# Patient Record
Sex: Female | Born: 1965 | Race: White | Hispanic: Yes | Marital: Married | Smoking: Never smoker
Health system: Southern US, Community
[De-identification: ages and names within clinical notes are randomized; demographics above are authoritative.]

## PROBLEM LIST (undated history)

## (undated) DIAGNOSIS — R079 Chest pain, unspecified: Secondary | ICD-10-CM

## (undated) DIAGNOSIS — E785 Hyperlipidemia, unspecified: Secondary | ICD-10-CM

## (undated) DIAGNOSIS — F329 Major depressive disorder, single episode, unspecified: Secondary | ICD-10-CM

## (undated) DIAGNOSIS — Z833 Family history of diabetes mellitus: Secondary | ICD-10-CM

## (undated) DIAGNOSIS — R0609 Other forms of dyspnea: Secondary | ICD-10-CM

## (undated) DIAGNOSIS — R739 Hyperglycemia, unspecified: Secondary | ICD-10-CM

## (undated) DIAGNOSIS — F410 Panic disorder [episodic paroxysmal anxiety] without agoraphobia: Secondary | ICD-10-CM

## (undated) DIAGNOSIS — K5792 Diverticulitis of intestine, part unspecified, without perforation or abscess without bleeding: Secondary | ICD-10-CM

## (undated) DIAGNOSIS — F419 Anxiety disorder, unspecified: Secondary | ICD-10-CM

## (undated) DIAGNOSIS — R0989 Other specified symptoms and signs involving the circulatory and respiratory systems: Secondary | ICD-10-CM

## (undated) HISTORY — DX: Other specified symptoms and signs involving the circulatory and respiratory systems: R09.89

## (undated) HISTORY — DX: Hyperlipidemia, unspecified: E78.5

## (undated) HISTORY — DX: Family history of diabetes mellitus: Z83.3

## (undated) HISTORY — DX: Hyperglycemia, unspecified: R73.9

## (undated) HISTORY — DX: Panic disorder (episodic paroxysmal anxiety): F41.0

## (undated) HISTORY — PX: ABDOMINOPLASTY: SUR9

## (undated) HISTORY — PX: CHOLECYSTECTOMY: SHX55

## (undated) HISTORY — DX: Anxiety disorder, unspecified: F41.9

## (undated) HISTORY — PX: BREAST REDUCTION SURGERY: SHX8

## (undated) HISTORY — DX: Other forms of dyspnea: R06.09

## (undated) HISTORY — DX: Chest pain, unspecified: R07.9

## (undated) HISTORY — DX: Major depressive disorder, single episode, unspecified: F32.9

## (undated) HISTORY — DX: Diverticulitis of intestine, part unspecified, without perforation or abscess without bleeding: K57.92

---

## 1997-11-25 ENCOUNTER — Other Ambulatory Visit: Admission: RE | Admit: 1997-11-25 | Discharge: 1997-11-25 | Payer: Self-pay | Admitting: Gynecology

## 1997-11-26 ENCOUNTER — Inpatient Hospital Stay (HOSPITAL_COMMUNITY): Admission: AD | Admit: 1997-11-26 | Discharge: 1997-11-26 | Payer: Self-pay | Admitting: Obstetrics and Gynecology

## 1997-11-27 ENCOUNTER — Ambulatory Visit (HOSPITAL_COMMUNITY): Admission: RE | Admit: 1997-11-27 | Discharge: 1997-11-27 | Payer: Self-pay | Admitting: Gynecology

## 1997-11-28 ENCOUNTER — Ambulatory Visit (HOSPITAL_COMMUNITY): Admission: RE | Admit: 1997-11-28 | Discharge: 1997-11-28 | Payer: Self-pay | Admitting: Gynecology

## 1999-06-07 ENCOUNTER — Other Ambulatory Visit: Admission: RE | Admit: 1999-06-07 | Discharge: 1999-06-07 | Payer: Self-pay | Admitting: Obstetrics and Gynecology

## 1999-06-09 ENCOUNTER — Encounter: Payer: Self-pay | Admitting: Obstetrics and Gynecology

## 1999-06-09 ENCOUNTER — Ambulatory Visit (HOSPITAL_COMMUNITY): Admission: RE | Admit: 1999-06-09 | Discharge: 1999-06-09 | Payer: Self-pay | Admitting: Obstetrics and Gynecology

## 1999-11-27 ENCOUNTER — Emergency Department (HOSPITAL_COMMUNITY): Admission: EM | Admit: 1999-11-27 | Discharge: 1999-11-27 | Payer: Self-pay | Admitting: Emergency Medicine

## 2000-02-20 ENCOUNTER — Inpatient Hospital Stay (HOSPITAL_COMMUNITY): Admission: AD | Admit: 2000-02-20 | Discharge: 2000-02-20 | Payer: Self-pay | Admitting: Obstetrics and Gynecology

## 2000-03-13 ENCOUNTER — Ambulatory Visit (HOSPITAL_COMMUNITY): Admission: RE | Admit: 2000-03-13 | Discharge: 2000-03-13 | Payer: Self-pay | Admitting: Obstetrics and Gynecology

## 2000-05-18 ENCOUNTER — Ambulatory Visit (HOSPITAL_COMMUNITY): Admission: RE | Admit: 2000-05-18 | Discharge: 2000-05-18 | Payer: Self-pay | Admitting: Obstetrics and Gynecology

## 2000-05-18 ENCOUNTER — Encounter: Payer: Self-pay | Admitting: Obstetrics and Gynecology

## 2000-05-31 ENCOUNTER — Inpatient Hospital Stay (HOSPITAL_COMMUNITY): Admission: AD | Admit: 2000-05-31 | Discharge: 2000-06-04 | Payer: Self-pay | Admitting: Obstetrics and Gynecology

## 2000-05-31 ENCOUNTER — Encounter (INDEPENDENT_AMBULATORY_CARE_PROVIDER_SITE_OTHER): Payer: Self-pay | Admitting: Specialist

## 2000-07-05 ENCOUNTER — Other Ambulatory Visit: Admission: RE | Admit: 2000-07-05 | Discharge: 2000-07-05 | Payer: Self-pay | Admitting: Obstetrics and Gynecology

## 2001-05-04 ENCOUNTER — Encounter: Payer: Self-pay | Admitting: Family Medicine

## 2001-05-04 ENCOUNTER — Encounter: Admission: RE | Admit: 2001-05-04 | Discharge: 2001-05-04 | Payer: Self-pay | Admitting: Family Medicine

## 2001-11-07 ENCOUNTER — Other Ambulatory Visit: Admission: RE | Admit: 2001-11-07 | Discharge: 2001-11-07 | Payer: Self-pay | Admitting: Obstetrics and Gynecology

## 2002-05-23 ENCOUNTER — Encounter: Admission: RE | Admit: 2002-05-23 | Discharge: 2002-05-23 | Payer: Self-pay | Admitting: Family Medicine

## 2002-05-23 ENCOUNTER — Encounter: Payer: Self-pay | Admitting: Family Medicine

## 2003-06-18 ENCOUNTER — Other Ambulatory Visit: Admission: RE | Admit: 2003-06-18 | Discharge: 2003-06-18 | Payer: Self-pay | Admitting: Obstetrics and Gynecology

## 2004-02-04 ENCOUNTER — Encounter: Admission: RE | Admit: 2004-02-04 | Discharge: 2004-02-04 | Payer: Self-pay | Admitting: Family Medicine

## 2004-03-18 ENCOUNTER — Ambulatory Visit (HOSPITAL_COMMUNITY): Admission: RE | Admit: 2004-03-18 | Discharge: 2004-03-19 | Payer: Self-pay | Admitting: General Surgery

## 2004-03-18 ENCOUNTER — Encounter (INDEPENDENT_AMBULATORY_CARE_PROVIDER_SITE_OTHER): Payer: Self-pay | Admitting: Specialist

## 2004-09-07 ENCOUNTER — Encounter: Admission: RE | Admit: 2004-09-07 | Discharge: 2004-09-07 | Payer: Self-pay | Admitting: Family Medicine

## 2005-09-02 ENCOUNTER — Other Ambulatory Visit: Admission: RE | Admit: 2005-09-02 | Discharge: 2005-09-02 | Payer: Self-pay | Admitting: Obstetrics and Gynecology

## 2005-09-08 ENCOUNTER — Encounter: Admission: RE | Admit: 2005-09-08 | Discharge: 2005-09-08 | Payer: Self-pay | Admitting: Family Medicine

## 2006-10-17 ENCOUNTER — Encounter: Admission: RE | Admit: 2006-10-17 | Discharge: 2006-10-17 | Payer: Self-pay | Admitting: Family Medicine

## 2007-09-10 ENCOUNTER — Encounter: Admission: RE | Admit: 2007-09-10 | Discharge: 2007-09-10 | Payer: Self-pay | Admitting: Family Medicine

## 2007-10-19 ENCOUNTER — Encounter: Admission: RE | Admit: 2007-10-19 | Discharge: 2007-10-19 | Payer: Self-pay | Admitting: Family Medicine

## 2008-10-20 ENCOUNTER — Encounter: Admission: RE | Admit: 2008-10-20 | Discharge: 2008-10-20 | Payer: Self-pay | Admitting: Family Medicine

## 2009-04-07 ENCOUNTER — Encounter: Payer: Self-pay | Admitting: Internal Medicine

## 2009-05-11 ENCOUNTER — Ambulatory Visit: Payer: Self-pay | Admitting: Internal Medicine

## 2009-05-11 DIAGNOSIS — R079 Chest pain, unspecified: Secondary | ICD-10-CM

## 2009-05-11 DIAGNOSIS — R0609 Other forms of dyspnea: Secondary | ICD-10-CM

## 2009-05-11 DIAGNOSIS — R0989 Other specified symptoms and signs involving the circulatory and respiratory systems: Secondary | ICD-10-CM | POA: Insufficient documentation

## 2009-05-11 HISTORY — DX: Chest pain, unspecified: R07.9

## 2009-05-15 ENCOUNTER — Encounter: Payer: Self-pay | Admitting: Internal Medicine

## 2009-05-15 ENCOUNTER — Ambulatory Visit: Payer: Self-pay

## 2009-05-15 ENCOUNTER — Ambulatory Visit (HOSPITAL_COMMUNITY): Admission: RE | Admit: 2009-05-15 | Discharge: 2009-05-15 | Payer: Self-pay | Admitting: Internal Medicine

## 2009-05-15 ENCOUNTER — Ambulatory Visit: Payer: Self-pay | Admitting: Cardiovascular Disease

## 2009-10-26 ENCOUNTER — Encounter: Admission: RE | Admit: 2009-10-26 | Discharge: 2009-10-26 | Payer: Self-pay | Admitting: Obstetrics and Gynecology

## 2010-08-15 LAB — CONVERTED CEMR LAB
Cholesterol: 174 mg/dL (ref 0–200)
LDL Cholesterol: 102 mg/dL — ABNORMAL HIGH (ref 0–99)

## 2010-08-18 ENCOUNTER — Encounter: Payer: Self-pay | Admitting: Family Medicine

## 2010-10-30 IMAGING — MG MM DIGITAL SCREENING
4 series · 4 of 4 positions shown · non-contrast
Comparison: none

DG SCREEN MAMMOGRAM BILATERAL
Bilateral CC and MLO view(s) were taken.

DIGITAL SCREENING MAMMOGRAM WITH CAD:
The breast tissue is almost entirely fatty.  No masses or malignant type calcifications are 
identified.  Compared with prior studies.
Images were processed with CAD.

[R CC]
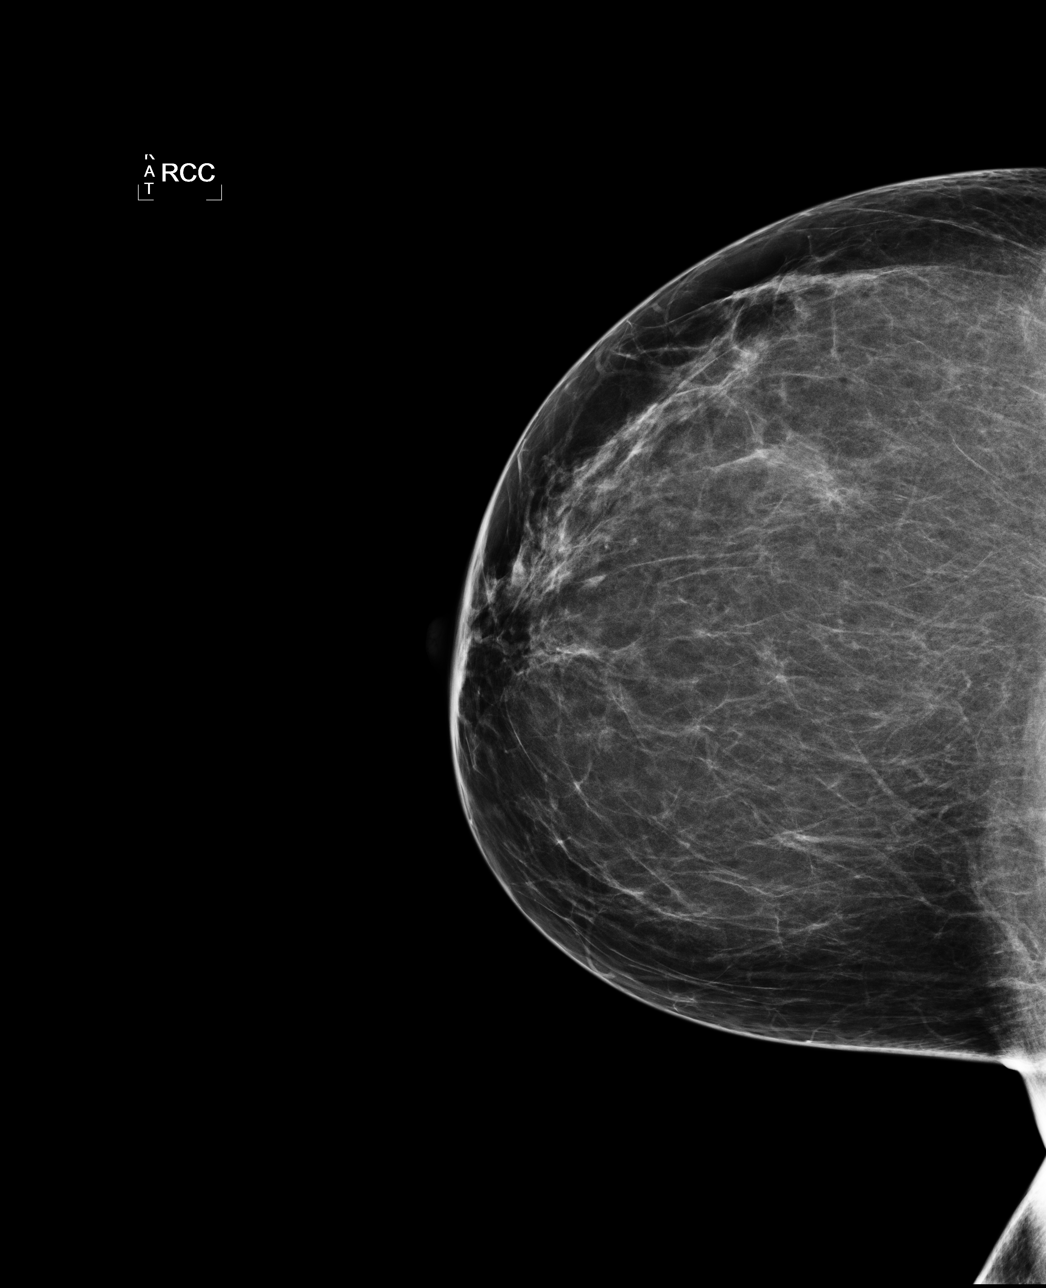

[L CC]
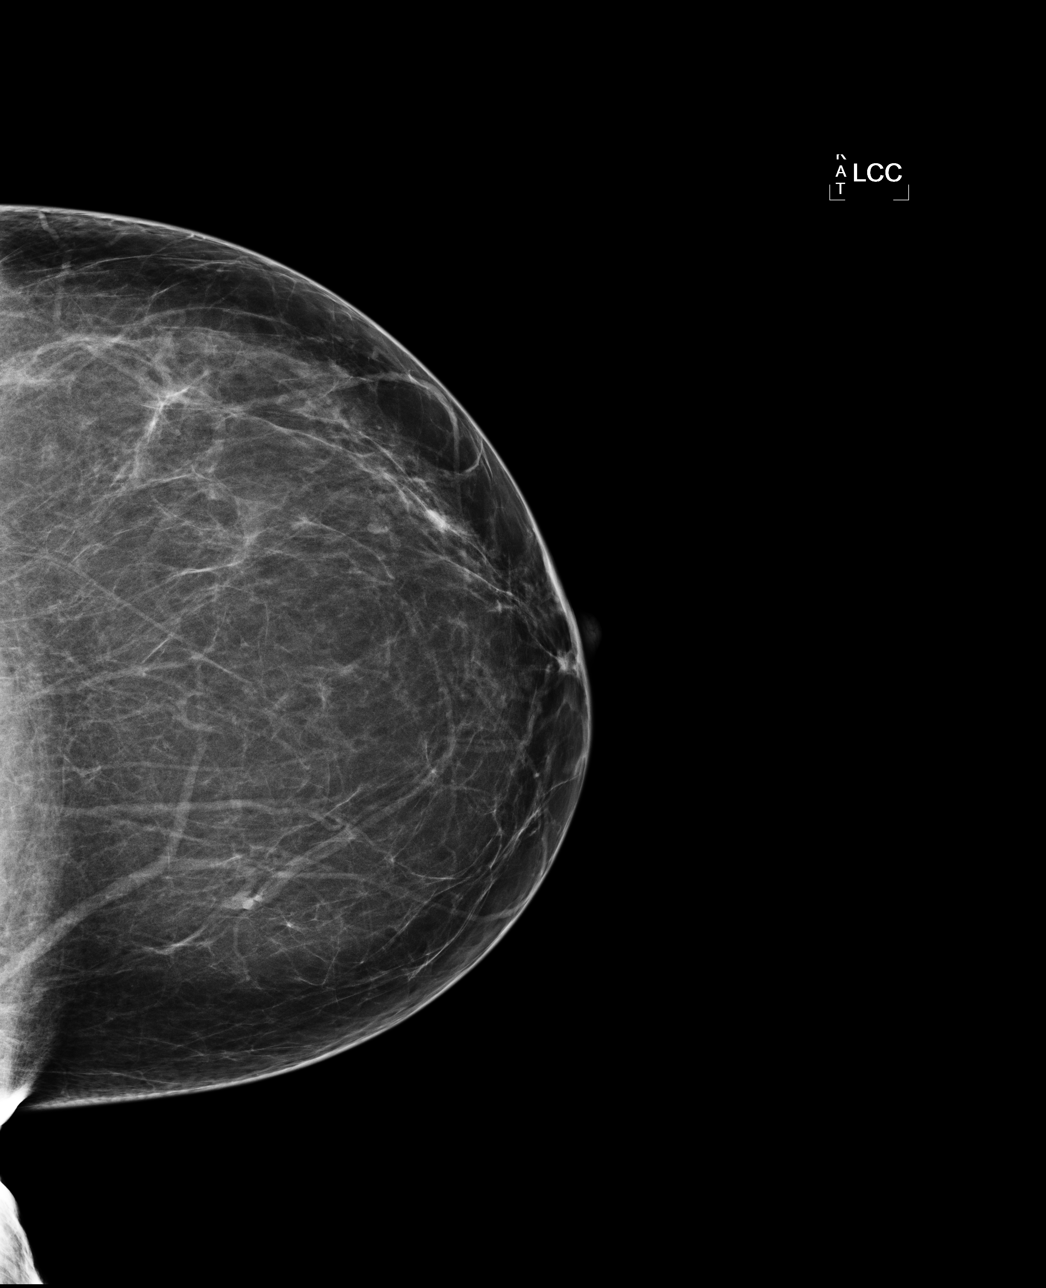

[L MLO]
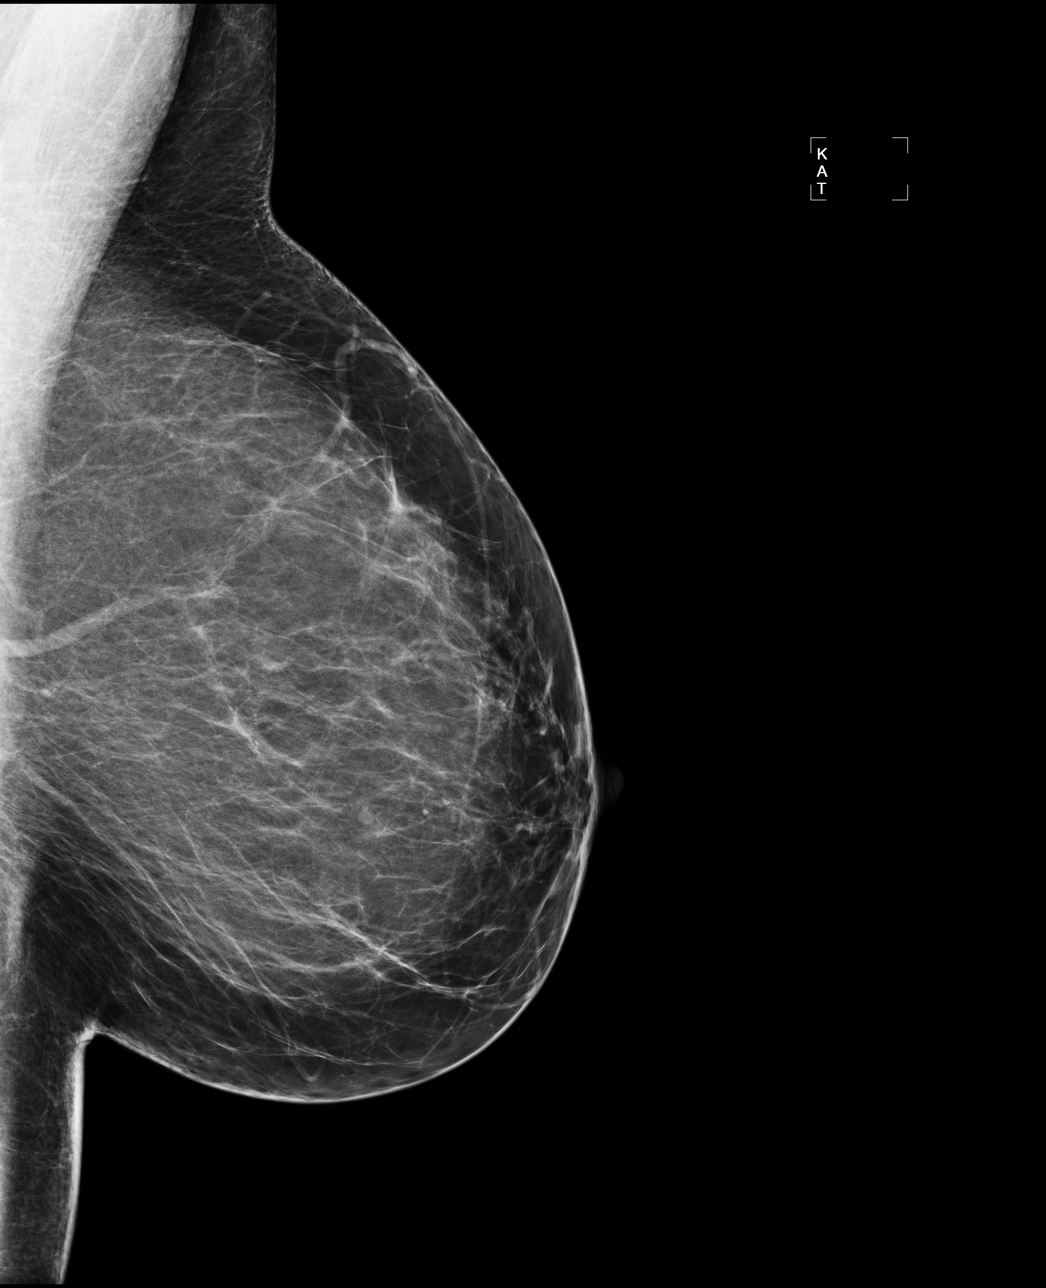

[R MLO]
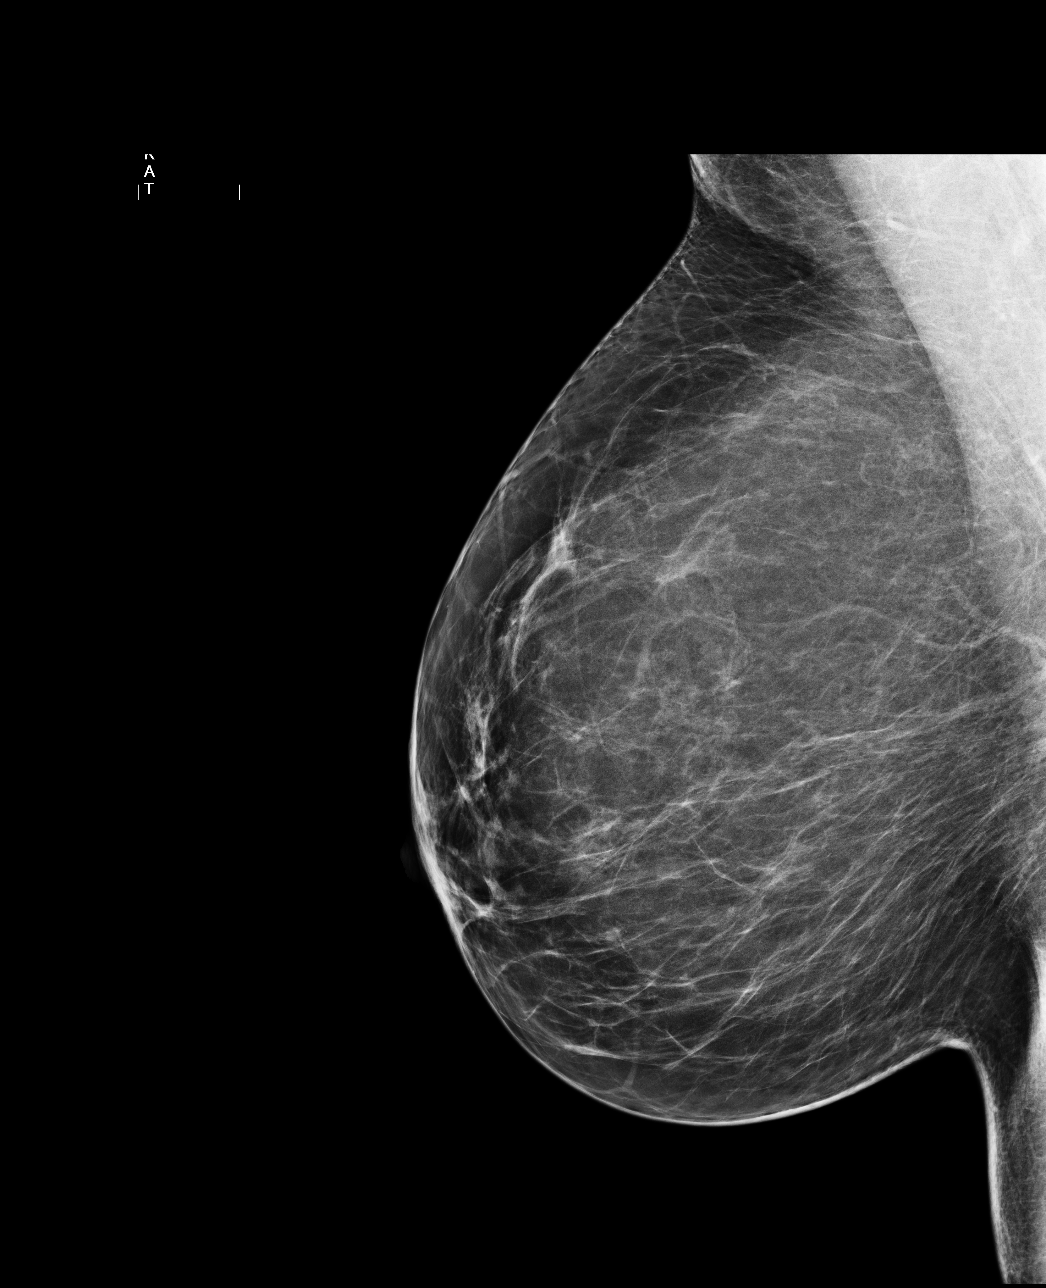

[4 of 4 positions shown; findings below may reference images not displayed]

IMPRESSION: No specific mammographic evidence of malignancy.  Next screening mammogram is recommended in one 
year.

A result letter of this screening mammogram will be mailed directly to the patient.

ASSESSMENT: Negative - BI-RADS 1

Screening mammogram in 1 year.
,

## 2010-12-03 NOTE — Discharge Summary (Signed)
[facility]  Patient:    Megan Rojas, Megan Rojas                      MRN: [id number] Adm. Date:  [id number] Disc. Date: [id number] Attending:  Madelyn Flavors Dictator:   Danie Chandler, R.N.                           Discharge Summary  ADMISSION DIAGNOSIS:          Intrauterine pregnancy at term, for induction of labor due to history of decreased fetal movement.  DISCHARGE DIAGNOSES:          1. Intrauterine pregnancy at term, for induction                                  of labor due to history of decreased fetal                                  movement.                               2. Arrest of dilatation and descent.                               3. 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  PROCEDURES:                   On June 01, 2000, primary low cervical transverse cesarean section and lysis of adhesions.  HISTORY OF PRESENT ILLNESS:   The patient is a 45 year old gravida 27, para 4, Hispanic female at term.  She was admitted for induction due to history of decreased fetal movement.  The patient has a history of HSV, and she has been on Valtrex.  There are no HSV symptoms.  The patient received Pitocin for augmentation of labor, and she progressed throughout the day.  Approximately 1:30 a.m. on June 01, 2000, the patient was still 7-7.5 cm dilated, 100% effaced, vertex at -1 station, with probable OP presentation.  There was no significant change for approximately two hours despite most uterine contractions being adequate.  The decision was made to proceed with a primary cesarean section for arrest of descent and dilation.  HOSPITAL COURSE:              The patient was taken to the operating room and underwent the above-named procedure without complications.  This was productive of a  viable female infant with Apgars of 7 at one minute and 8 at five minutes and an arterial cord pH of 7.32.  Postoperatively on day #1, the patients hemoglobin was 9.6, hematocrit 26.7, and white blood cell count 12.9.  On postoperative day #2  the patient had a good return of bowel function and was tolerating a regular diet.  She was also ambulating well without difficulty and had good pain control.  She was discharged home on postoperative day #3.  CONDITION ON DISCHARGE:       Good.  DIET:                         Regular as tolerated.  ACTIVITY:                     No heavy lifting, no driving, no vaginal entry.  FOLLOW-UP:                    She is to follow up in the office in one to two weeks for an incision check.  DISCHARGE INSTRUCTIONS:       She is to call for temperature greater than 100 degrees, persistent nausea or vomiting, heavy vaginal bleeding, and/or redness or drainage from the incision site.  DISCHARGE MEDICATIONS:        1. Prenatal vitamin 1 p.o. q.d.                               2. Tylox #30 1-2 p.o. q.4h. p.r.n. pain. DD: 06/28/00 TD:  06/28/00 Job: [id number]

## 2010-12-03 NOTE — Op Note (Signed)
[facility]  Patient:    Megan Rojas, Megan Rojas                      MRN: [id number] Adm. Date:  [id number] Attending:  Madelyn Flavors CC:         [facility]   Operative Report  PREOPERATIVE DIAGNOSIS:       Arrest of dilatation and descent.  POSTOPERATIVE DIAGNOSES:      1. Arrest of dilatation and descent.                               2. 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  OPERATION:                    1. Primary cesarean section, low cervical                                  transverse.                               2. Lysis of adhesions.  SURGEON:                      Beather Arbour. Thomasena Edis, M.D.  ANESTHESIA:                   Epidural.  ESTIMATED BLOOD LOSS:         700 cc.  DRAINS:                       Foley.  FLUIDS:                       2000 cc of crystalloid.  The patient received                               ampicillin one hour prior to her section.  COMPLICATIONS:                None.  DESCRIPTION OF PROCEDURE:     The patient was brought to the operating room, identified on the operating table.  After topping off the patients epidural, she was placed in the left uterine displacement position and prepped and draped in a usual sterile fashion.  A Foley catheter had already been placed in the labor room.  Pfannenstiel incision was made and carried down to the fascia.  Fascia was scored in the midline, extended bilaterally using Mayo scissors.  It was then separated free from the underlying muscle.  The muscles were separated in a line down to the symphysis.  The peritoneum was entered bluntly, taking care to avoid bowel or other abdominal contents.  The peritoneal incision was extended superiorly, then  inferiorly, down to the bladder edge.  The bladder blade was placed,  and the bladder flap was developed.  The lower uterine segment was noted to be distally not very well developed at all, such that if the incision had been placed in that portion, it would have extended into the vessels.  Thus, it was placed in a more superior fashion but was still a low cervical transverse incision.  The incision was then extended in a curvilinear fashion using bandage scissors. Upon entry into the uterine cavity, there was noted to be a clot which emanated from the incision.  All instruments were removed from the operative field.  The fetal vertex was delivered without difficulty onto the operative field.  It was noted to be in the ROP position.  There was a nuchal cord x 1, which was easily reduced.  The infant was bulb-suctioned and the remainder of the infant was delivered onto the operative field.  There was noted to be a copious amount of blood in the infants mouth, and as much as this was possible it was bulb suctioned prior to handing the baby to the NICU resuscitation team after doubly clamping and cutting the cord.  Cord blood was collected.  Cord pH was sent and found to be 7.32.  The placenta was manually removed and sent to pathology for examination due to the possible history of abruption, even though this had not been clinically evident.  The uterus was closed in two layers, the first with a running interlocking layer of 0 Monocryl, the second was a running imbricating layer of 0 Monocryl.  This was done after the uterus was exteriorized and wrapped in a wet lap pack, and the bladder blade was replaced.  There were noted to be two bleeding edges with adequate hemostasis achieved using figure-of-eight sutures of 0 Monocryl. There was noted to be adhesion of the omentum to the anterior surface of the uterus, and this was carefully lysed.  Adequate hemostasis was noted.  The right tube was noted to be adhesed closely across the anterior surface of the uterus  such that this would increase the patients risk to develop an ectopic pregnancy.  Normal anatomy was restored with lysis of these adhesions.  After noting the uterus, tubes, and ovaries appeared grossly normal, the uterus was placed back into the abdominal cavity and the uterine incision was again copiously irrigated with warm lactated Ringers.  The gutters were irrigated as well free of clots.  After noting excellent uterine incision hemostasis, the bladder flap was closed using a simple running suture of 2-0 Monocryl in a simple running fashion.  Kelly clamps were placed on the peritoneum, and the peritoneum was closed using a simple running suture of 2-0 Monocryl.  The fascial areas were examined for evidence of hemostasis, and excellent hemostasis was achieved using cautery.  The muscles were tacked together in the midline and using three interrupted sutures of 2-0 Monocryl.  The fascia was closed using a suture of 0 PDS leftmost-angled incision, run to the rightmost-angled incision in a simple running fashion and tied.  The subcutaneous tissue was irrigated copiously with warm lactated Ringers and excellent hemostasis achieved using cautery.  The skin was closed with staples, and Steri-Strips were applied.  The patient tolerated the procedure well without apparent complications, was transferred to the recovery room in stable condition after all instrument, sponge, and needle counts were correct. The baby was found to be a viable female, Apgars 7 and 8, cord  pH 7.32, and went to the normal newborn nursery. DD:  06/01/00 TD:  06/01/00 Job: [id number]

## 2010-12-03 NOTE — Op Note (Signed)
NAME:  Megan Rojas, Megan Rojas                       ACCOUNT NO.:  [id number]   MEDICAL RECORD NO.:  [id number]                   PATIENT TYPE:  OIB   LOCATION:  NA                                   FACILITY:  [facility]   PHYSICIAN:  Jimmye Norman III, M.D.               DATE OF BIRTH:  October 23, 1965   DATE OF PROCEDURE:  03/18/2004  DATE OF DISCHARGE:                                 OPERATIVE REPORT   PREOPERATIVE DIAGNOSES:  Symptomatic cholelithiasis.   POSTOPERATIVE DIAGNOSES:  Symptomatic cholelithiasis.   OPERATION PERFORMED:  Laparoscopic cholecystectomy with cholangiogram.   SURGEON:  Marta Lamas. Lindie Spruce, M.D.   ASSISTANT:  Anselm Pancoast. Zachery Dakins, M.D.   ANESTHESIA:  General endotracheal.   ESTIMATED BLOOD LOSS:  20 to 30 mL.   COMPLICATIONS:  None.   CONDITION:  Stable.   INDICATIONS FOR PROCEDURE:  The patient is a 45 year old female with one  severe attack around 4th of July of abdominal pain in the right upper  quadrant associated with nausea and vomiting.  Comes in now for an elective  laparoscopic cholecystectomy.   OPERATIVE FINDINGS:  The patient had stones in the gallbladder approximately  four 1 to 2 cm stones, no evidence of acute inflammation but there was some  chronic edema in the gallbladder fossa.  Cholangiogram was normal.   DESCRIPTION OF PROCEDURE:  The patient was taken to the operating room and  placed on the table in the supine position.  After an adequate endotracheal  anesthetic was administered, the patient was prepped and draped in the usual  sterile manner exposing the midline and the right upper quadrant.  The patient was placed in reversed Trendelenburg position.  A supraumbilical  incision was made using an 11 blade and a Veress needle was then passed  through the supraumbilical fascia into the peritoneal cavity under.  It was  confirmed to be in position using the saline test and once that was done,  carbon dioxide insufflation was instilled into the  peritoneal up to a  maximal intra-abdominal pressure of 15 mmHg.  We then used an OptiVue  cannula 11-12 mm cannula passed into the peritoneal cavity through the  supraumbilical fascia using the attached camera and light source.  Once the  cannula was in place, two right costal margin 5 mm cannulas and subxiphoid  11-12 mm cannula passed under direct vision into the peritoneal cavity.  The  patient was placed in steeper reversed Trendelenburg.  The left side was  turned down and the dissection begun.   A ratcheted grasper was passed through the lateral most 5 mm cannula and  used to retract the dome of the gallbladder towards the anterior abdominal  wall and right upper quadrant.  A second grasper was passed onto the  infundibulum exposing the peritoneum overlying the hepatoduodenal triangle  and the triangle of Calot.  This peritoneum was dissected out exposing both  the cystic duct  and the cystic artery.  The cystic artery was doubly clipped  proximally and then distally singly clipped and transected.  We then  isolated the cystic duct and passed a proximal clip along the gallbladder  side, made a cholecystodochotomy through which the [facility] catheter was passed  which had been passed through the anterior abdominal wall.  This was for the  patient's cholangiogram.   The cholangiogram showed the findings mentioned previously which was normal  flow into the duodenum. Good proximal filling and no evidence of intraductal  stones.  Once cholangiogram was completed, we removed the cannula from the  cystic duct and subsequently endoclipped the cystic duct distally and  transected it.  We then dissected out the gallbladder from the hepatic fossa  using electrocautery.  Once this was done, there was excellent hemostasis of  the gallbladder bed which was augmented by cauterization after the  gallbladder was completely removed.  Removed the gallbladder from the  supraumbilical site using just  Kelly  clamp and a grasper.  We had to mildly  dilate the site using a Kelly clamp; however, no actual fascial incision was  necessary.  Once this was done, we irrigated the gallbladder fossa using  saline, used about 800 mL of saline in order to irrigate the bed and also  the right upper quadrant.  There was minimal bleeding; however, there was  some bleeding from the site where the [facility] catheter passed through the  anterior abdominal wall.  This was controlled with electrocautery.  The  supraumbilical fascia was reapproximated using a figure-of-eight stitch of 0  Vicryl.  Gas was allowed to escape through all cannulas and once all  cannulas were removed, we injected all skin sites using 0.25% Marcaine with  epinephrine.  We then closed the skin using running subcuticular stitch of 4-  0 Vicryl.  Sterile dressings were applied including Steri-Strips.  All  sponge, needle and instrument counts were correct at the conclusion of the  case.                                               Kathrin Ruddy, M.D.    JW/MEDQ  D:  03/18/2004  T:  03/18/2004  Job:  [id number]

## 2012-08-27 ENCOUNTER — Other Ambulatory Visit: Payer: Self-pay | Admitting: Obstetrics and Gynecology

## 2012-08-27 DIAGNOSIS — R928 Other abnormal and inconclusive findings on diagnostic imaging of breast: Secondary | ICD-10-CM

## 2012-09-06 ENCOUNTER — Ambulatory Visit
Admission: RE | Admit: 2012-09-06 | Discharge: 2012-09-06 | Disposition: A | Discharge status: Home / Self Care | Payer: BC Managed Care – PPO | Source: Ambulatory Visit | Attending: Obstetrics and Gynecology | Admitting: Obstetrics and Gynecology

## 2014-07-18 HISTORY — PX: COLONOSCOPY: SHX174

## 2014-10-24 ENCOUNTER — Other Ambulatory Visit: Payer: Self-pay | Admitting: Obstetrics and Gynecology

## 2014-10-27 LAB — CYTOLOGY - PAP

## 2015-07-19 HISTORY — PX: OTHER SURGICAL HISTORY: SHX169

## 2015-07-19 HISTORY — PX: REDUCTION MAMMAPLASTY: SUR839

## 2015-08-06 NOTE — Progress Notes (Signed)
[place]

## 2015-08-07 ENCOUNTER — Encounter: Payer: Self-pay | Admitting: Cardiovascular Disease

## 2015-08-31 NOTE — Progress Notes (Signed)
[place]on

## 2015-09-07 ENCOUNTER — Encounter: Payer: Self-pay | Admitting: Cardiovascular Disease

## 2015-09-09 ENCOUNTER — Encounter: Payer: Self-pay | Admitting: Cardiovascular Disease

## 2015-10-23 ENCOUNTER — Other Ambulatory Visit: Payer: Self-pay

## 2015-10-23 DIAGNOSIS — Z1231 Encounter for screening mammogram for malignant neoplasm of breast: Secondary | ICD-10-CM

## 2015-11-09 ENCOUNTER — Ambulatory Visit
Admission: RE | Admit: 2015-11-09 | Discharge: 2015-11-09 | Disposition: A | Discharge status: Home / Self Care | Payer: BLUE CROSS/BLUE SHIELD | Source: Ambulatory Visit

## 2015-11-09 DIAGNOSIS — Z1231 Encounter for screening mammogram for malignant neoplasm of breast: Secondary | ICD-10-CM

## 2015-11-10 ENCOUNTER — Ambulatory Visit: Payer: Self-pay

## 2016-05-04 ENCOUNTER — Encounter: Payer: Self-pay | Admitting: Physician Assistant

## 2016-05-04 ENCOUNTER — Ambulatory Visit (INDEPENDENT_AMBULATORY_CARE_PROVIDER_SITE_OTHER): Payer: BLUE CROSS/BLUE SHIELD | Admitting: Physician Assistant

## 2016-05-04 VITALS — BP 144/104 | HR 86 | Ht 64.0 in | Wt 143.4 lb

## 2016-05-04 DIAGNOSIS — F419 Anxiety disorder, unspecified: Secondary | ICD-10-CM | POA: Diagnosis not present

## 2016-05-04 DIAGNOSIS — M79602 Pain in left arm: Secondary | ICD-10-CM | POA: Diagnosis not present

## 2016-05-04 DIAGNOSIS — R002 Palpitations: Secondary | ICD-10-CM | POA: Diagnosis not present

## 2016-05-04 DIAGNOSIS — R0602 Shortness of breath: Secondary | ICD-10-CM | POA: Diagnosis not present

## 2016-05-04 NOTE — Progress Notes (Signed)
Cardiology Office Note    Date:  05/04/2016   ID:  Megan Rojas, DOB 03/30/1966, MRN [id number]  PCP:  Abigail Miyamoto, MD  Cardiologist:  Remotely seen by Dr. Harrington Challenger   Chief Complaint  Patient presents with  . Establish Care    seen with DOD Dr. Ellyn Hack, previously seen by Dr. Harrington Challenger in 2010    History of Present Illness:  Megan Rojas is a 50 y.o. female with past medical history of hyperglycemia, depression and elevated blood pressure but not on any blood pressure medication. She was seen by Dr. Harrington Challenger in 2010 for evaluation of chest pain. She had no known history of coronary artery disease. Her episode of chest discomfort and left arm tingling sometimes can last for days. She eventually underwent a stress echocardiogram which came back negative. Her chest pain was felt to be noncardiac and likely related to musculoskeletal in nature. Patient has not been seen by cardiology since.   She presents to cardiology office today for evaluation of left arm pain and shortness of breath at the recommendation of her PCP. She appears to be agitated. She says she is concerned about her heart as she had multiple sibling with heart issue before age 59. She continue to have this chronic left arm pain radiating from the left upper shoulder all the way down to her left hand. She denies any cervical neck issues or previous shoulder injury. She says her left arm pain sometimes can last days at a time. She also mentions that when she exerted herself, she feels like sometimes she cannot catch a deep breath. Interestingly, the same thing happens when she is agitated and feels like she is about to have a panic attack. Her mother just passed away a month ago therefore she is under a lot of stress. She also says in the past 3 month, she has had 3-4 episodes of palpitation. The longest episode was 4-5 minutes each.    Past Medical History:  Diagnosis Date  . CHEST PAIN UNSPECIFIED 05/11/2009   Qualifier:  Diagnosis of  By: Marilynne Halsted, RN, BSN, Jacquelyn    . Depression   . Elevated blood pressure   . Hyperglycemia   . OTHER DYSPNEA AND RESPIRATORY ABNORMALITIES 05/11/2009   Qualifier: Diagnosis of  By: Marilynne Halsted RN, BSN, Jacquelyn      Past Surgical History:  Procedure Laterality Date  . ABDOMINOPLASTY     "tummy tuck"  . BREAST REDUCTION SURGERY      Current Medications: Outpatient Medications Prior to Visit  Medication Sig Dispense Refill  . buPROPion (WELLBUTRIN XL) 300 MG 24 hr tablet Take 300 mg by mouth daily.    Marland Kitchen zolpidem (AMBIEN) 5 MG tablet Take 5 mg by mouth at bedtime as needed for sleep.    Marland Kitchen estradiol (ESTRACE) 2 MG tablet Take 2 mg by mouth daily.    . MetroNIDAZOLE-Cleanser 0.75 % (Cream) KIT Apply topically. Apply 1 application a thin layer to affected area twice a day as needed     No facility-administered medications prior to visit.      Allergies:   Review of patient's allergies indicates no known allergies.   Social History   Social History  . Marital status: Married    Spouse name: N/A  . Number of children: N/A  . Years of education: N/A   Social History Main Topics  . Smoking status: Never Smoker  . Smokeless tobacco: None  . Alcohol use Yes  Comment: occasionally  . Drug use: No  . Sexual activity: Yes   Other Topics Concern  . None   Social History Narrative  . None     Family History:  The patient's family history includes Diabetes in her mother; Heart attack in her sister; Transient ischemic attack in her brother.   ROS:   Please see the history of present illness.    ROS All other systems reviewed and are negative.   PHYSICAL EXAM:   VS:  BP (!) 144/104   Pulse 86   Ht _0  (1.626 m)   Wt 143 lb 6.4 oz (65 kg)   BMI 24.61 kg/m    GEN: Well nourished, well developed, in no acute distress  HEENT: normal  Neck: no JVD, carotid bruits, or masses Cardiac: RRR; no murmurs, rubs, or gallops,no edema  Respiratory:  clear to  auscultation bilaterally, normal work of breathing GI: soft, nontender, nondistended, + BS MS: no deformity or atrophy  Skin: warm and dry, no rash Neuro:  Alert and Oriented x 3, Strength and sensation are intact Psych: euthymic mood, full affect  Wt Readings from Last 3 Encounters:  05/04/16 143 lb 6.4 oz (65 kg)  05/11/09 128 lb (58.1 kg)      Studies/Labs Reviewed:   EKG:  EKG is ordered today.  The ekg ordered today demonstrates NSR without significant ST-T wave changes  Recent Labs: No results found for requested labs within last 8760 hours.   Lipid Panel    Component Value Date/Time   CHOL 174 05/11/2009 0000   TRIG 91.0 05/11/2009 0000   HDL 53.60 05/11/2009 0000   CHOLHDL 3 05/11/2009 0000   VLDL 18.2 05/11/2009 0000   LDLCALC 102 (H) 05/11/2009 0000    Additional studies/ records that were reviewed today include:    Stress echo 05/15/2009    ASSESSMENT:    1. Left arm pain   2. Palpitation   3. Anxiety   4. SOB (shortness of breath)      PLAN:  In order of problems listed above:  1. Left arm pain: Unfortunately we did not receive record from PCPs office today although no EKG. Differential diagnosis for her left arm pain including angina versus impinged cervical nerve versus rotator cuff problem. I try to reproduce the symptom by making her lift up her arm, it did not cause any discomfort. I think the chance of significant angina is fairly low in this patient given the persistent symptoms for days at a time. Will discuss with M.D., potentially consider echocardiogram, cardiopulmonary stress test and a calcium score as initial evaluation. If later she does develop exertional symptom, a coronary CT would be reasonable. Note despite her history of childhood murmur, I did not appreciate significant heart murmur on physical exam.  2. Palpitation: She only had 3 episode of palpitation in the last 3 months, her symptom is fairly short acting, I think observation  is reasonable in this case. 24 or 48 hour Holter monitor would not be beneficial in this case due to the rarity of the symptom. If it does increase in frequency, we can potentially consider a 30 day event monitor.  3. Anxiety/Depression: Patient appears to be very anxious while singing in the room, talking very fast. We'll obtain a TSH to rule out possible hyperthyroidism. Otherwise, I am hesitant to add a beta blocker for her anxiety given her history of depression. She says years ago, she used to cry uncontrollably, however she is happy  now, she does take antidepressant    Medication Adjustments/Labs and Tests Ordered: Current medicines are reviewed at length with the patient today.  Concerns regarding medicines are outlined above.  Medication changes, Labs and Tests ordered today are listed in the Patient Instructions below. Patient Instructions  Medication Instructions:  Continue current medications  Labwork: TSH  Testing/Procedures: Your physician has requested that you have a Cardiopulmonary stress test. For further information please visit [email or web address]. Please follow instruction sheet, as given.  Your physician has requested that you have an echocardiogram. Echocardiography is a painless test that uses sound waves to create images of your heart. It provides your doctor with information about the size and shape of your heart and how well your heart's chambers and valves are working. This procedure takes approximately one hour. There are no restrictions for this procedure.  Your physician has requested that you have a Coronary Calcium scoring test   Follow-Up: Your physician recommends that you schedule a follow-up appointment in: 3 Months with Dr Harrington Challenger   Any Other Special Instructions Will Be Listed Below (If Applicable).     If you need a refill on your cardiac medications before your next appointment, please call your pharmacy.      Megan Rojas, [place]  05/04/2016  4:32 PM    [facility] [place], [place], [place]  [address] Phone: [phone]; Fax: [phone]    I have seen, examined and evaluated the patient this afternoon along with Megan Rojas .  After reviewing all the available data and chart, we discussed the patients laboratory, study & physical findings as well as symptoms in detail. I agree with his findings, examination as well as impression recommendations as per our discussion.    For patient of Dr. Dorris Carnes who has a significant family history of coronary disease returns for evaluation of left arm pain. This is in the setting of a lot of stress related to the recent death of her mother. She seems very distressed. She notes left-sided arm pain and exertional dyspnea. I do with the plan to evaluate this with a CPET as opposed to just GXT -- can better assess etiology of DOE.  May potentially benefit from Echo pending these results.  Coronary Calcium score is a good choice for risk stratification as well.  Pending these results, she will follow-up annually or more urgently if tests are abnormal.    Megan Rojas, M.D., M.S. Interventional Cardiologist   Pager # [phone] Phone # [phone] [address] [place], [place] [address]

## 2016-05-04 NOTE — Patient Instructions (Addendum)
Medication Instructions:  Continue current medications  Labwork: TSH  Testing/Procedures: Your physician has requested that you have a Cardiopulmonary stress test. For further information please visit [email or web address] Please follow instruction sheet, as given.  Your physician has requested that you have an echocardiogram. Echocardiography is a painless test that uses sound waves to create images of your heart. It provides your doctor with information about the size and shape of your heart and how well your heart's chambers and valves are working. This procedure takes approximately one hour. There are no restrictions for this procedure.  Your physician has requested that you have a Coronary Calcium scoring test   Follow-Up: Your physician recommends that you schedule a follow-up appointment in: 3 Months with Dr Tenny Crawoss   Any Other Special Instructions Will Be Listed Below (If Applicable).     If you need a refill on your cardiac medications before your next appointment, please call your pharmacy.

## 2016-05-25 ENCOUNTER — Ambulatory Visit (HOSPITAL_COMMUNITY): Payer: BLUE CROSS/BLUE SHIELD | Attending: Cardiology

## 2016-05-25 ENCOUNTER — Other Ambulatory Visit: Payer: BLUE CROSS/BLUE SHIELD | Admitting: *Deleted

## 2016-05-25 ENCOUNTER — Other Ambulatory Visit: Payer: Self-pay

## 2016-05-25 ENCOUNTER — Ambulatory Visit (INDEPENDENT_AMBULATORY_CARE_PROVIDER_SITE_OTHER)
Admission: RE | Admit: 2016-05-25 | Discharge: 2016-05-25 | Disposition: A | Discharge status: Home / Self Care | Payer: Self-pay | Source: Ambulatory Visit | Attending: Physician Assistant | Admitting: Physician Assistant

## 2016-05-25 DIAGNOSIS — R0602 Shortness of breath: Secondary | ICD-10-CM | POA: Diagnosis present

## 2016-05-25 DIAGNOSIS — R002 Palpitations: Secondary | ICD-10-CM | POA: Insufficient documentation

## 2016-05-25 DIAGNOSIS — I200 Unstable angina: Secondary | ICD-10-CM

## 2016-05-25 DIAGNOSIS — M79602 Pain in left arm: Secondary | ICD-10-CM | POA: Diagnosis not present

## 2016-05-25 LAB — TSH: TSH: 3.33 m[IU]/L

## 2016-05-25 NOTE — Progress Notes (Signed)
Sh

## 2016-05-25 NOTE — Addendum Note (Signed)
Addended by: Tonita PhoenixBOWDEN, Westin Knotts K on: 05/25/2016 09:10 AM   Modules accepted: Orders

## 2016-05-30 ENCOUNTER — Ambulatory Visit (HOSPITAL_COMMUNITY): Payer: BLUE CROSS/BLUE SHIELD | Attending: Cardiology

## 2016-05-30 DIAGNOSIS — R002 Palpitations: Secondary | ICD-10-CM | POA: Insufficient documentation

## 2016-05-30 DIAGNOSIS — R0602 Shortness of breath: Secondary | ICD-10-CM | POA: Diagnosis not present

## 2016-05-30 DIAGNOSIS — M79602 Pain in left arm: Secondary | ICD-10-CM | POA: Insufficient documentation

## 2016-05-31 ENCOUNTER — Other Ambulatory Visit (HOSPITAL_COMMUNITY): Payer: Self-pay | Admitting: *Deleted

## 2016-05-31 DIAGNOSIS — M79602 Pain in left arm: Secondary | ICD-10-CM

## 2016-05-31 DIAGNOSIS — R002 Palpitations: Secondary | ICD-10-CM

## 2016-05-31 DIAGNOSIS — R0602 Shortness of breath: Secondary | ICD-10-CM | POA: Diagnosis not present

## 2016-06-07 ENCOUNTER — Telehealth: Payer: Self-pay | Admitting: Physician Assistant

## 2016-06-07 NOTE — Telephone Encounter (Signed)
Returning your call. °

## 2016-06-07 NOTE — Telephone Encounter (Signed)
Spoke to patient regarding recent echo stress test results and recommendations for BP monitoring at home. She does note she checks her BP at home, but often in response to situational symptoms. Notes sometimes she gets stressed or face gets flushed and she will check BP and noted it to be high. I advised checking at rest, best thing to do would be to establish a daily time to check reading and establish a trend rather than checking in context of symptoms. She is going to take 1-2 weeks of readings and call us back with her journal entries.

## 2016-09-28 ENCOUNTER — Other Ambulatory Visit: Payer: Self-pay | Admitting: Family Medicine

## 2016-09-28 DIAGNOSIS — Z1231 Encounter for screening mammogram for malignant neoplasm of breast: Secondary | ICD-10-CM

## 2016-11-09 ENCOUNTER — Ambulatory Visit
Admission: RE | Admit: 2016-11-09 | Discharge: 2016-11-09 | Disposition: A | Discharge status: Home / Self Care | Payer: BLUE CROSS/BLUE SHIELD | Source: Ambulatory Visit | Attending: Family Medicine | Admitting: Family Medicine

## 2016-11-09 DIAGNOSIS — Z1231 Encounter for screening mammogram for malignant neoplasm of breast: Secondary | ICD-10-CM

## 2016-11-10 ENCOUNTER — Other Ambulatory Visit: Payer: Self-pay | Admitting: Family Medicine

## 2016-11-10 DIAGNOSIS — R928 Other abnormal and inconclusive findings on diagnostic imaging of breast: Secondary | ICD-10-CM

## 2016-11-15 ENCOUNTER — Ambulatory Visit
Admission: RE | Admit: 2016-11-15 | Discharge: 2016-11-15 | Disposition: A | Discharge status: Home / Self Care | Payer: BLUE CROSS/BLUE SHIELD | Source: Ambulatory Visit | Attending: Family Medicine | Admitting: Family Medicine

## 2016-11-15 DIAGNOSIS — R928 Other abnormal and inconclusive findings on diagnostic imaging of breast: Secondary | ICD-10-CM

## 2016-12-01 ENCOUNTER — Other Ambulatory Visit: Payer: Self-pay | Admitting: Family Medicine

## 2016-12-01 ENCOUNTER — Ambulatory Visit
Admission: RE | Admit: 2016-12-01 | Discharge: 2016-12-01 | Disposition: A | Discharge status: Home / Self Care | Payer: BLUE CROSS/BLUE SHIELD | Source: Ambulatory Visit | Attending: Family Medicine | Admitting: Family Medicine

## 2016-12-01 DIAGNOSIS — M25511 Pain in right shoulder: Secondary | ICD-10-CM

## 2017-05-15 ENCOUNTER — Other Ambulatory Visit: Payer: Self-pay | Admitting: Orthopedic Surgery

## 2017-05-15 DIAGNOSIS — M25511 Pain in right shoulder: Secondary | ICD-10-CM

## 2017-05-22 ENCOUNTER — Ambulatory Visit
Admission: RE | Admit: 2017-05-22 | Discharge: 2017-05-22 | Disposition: A | Discharge status: Home / Self Care | Payer: BLUE CROSS/BLUE SHIELD | Source: Ambulatory Visit | Attending: Orthopedic Surgery | Admitting: Orthopedic Surgery

## 2017-05-22 DIAGNOSIS — M25511 Pain in right shoulder: Secondary | ICD-10-CM

## 2017-10-18 ENCOUNTER — Other Ambulatory Visit: Payer: Self-pay | Admitting: Obstetrics and Gynecology

## 2017-11-13 IMAGING — MG 2D DIGITAL SCREENING BILATERAL MAMMOGRAM WITH CAD AND ADJUNCT TO
8 of 12 series · 8 of 28 positions shown · non-contrast
Comparison: Previous exam(s).

CLINICAL DATA: Screening.

EXAM:
2D DIGITAL SCREENING BILATERAL MAMMOGRAM WITH CAD AND ADJUNCT TOMO

[L MLO]
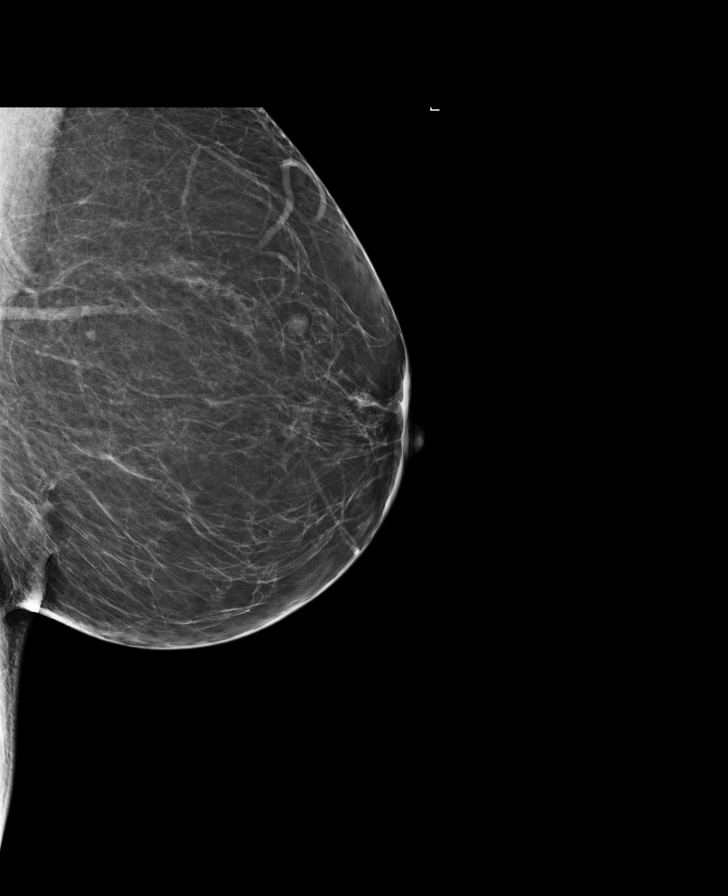

[R MLO synth-2D]
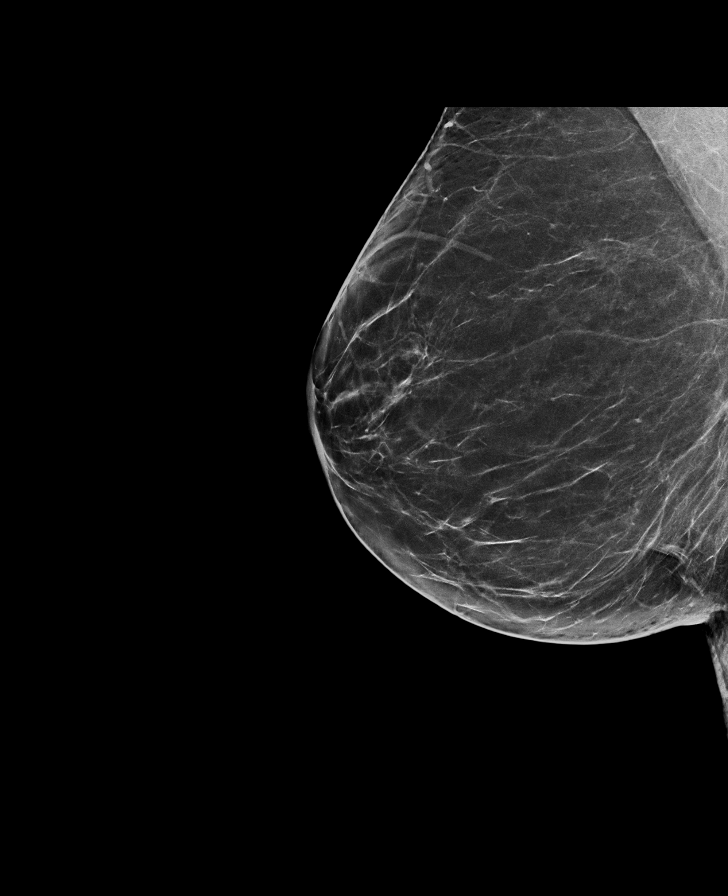

[R MLO]
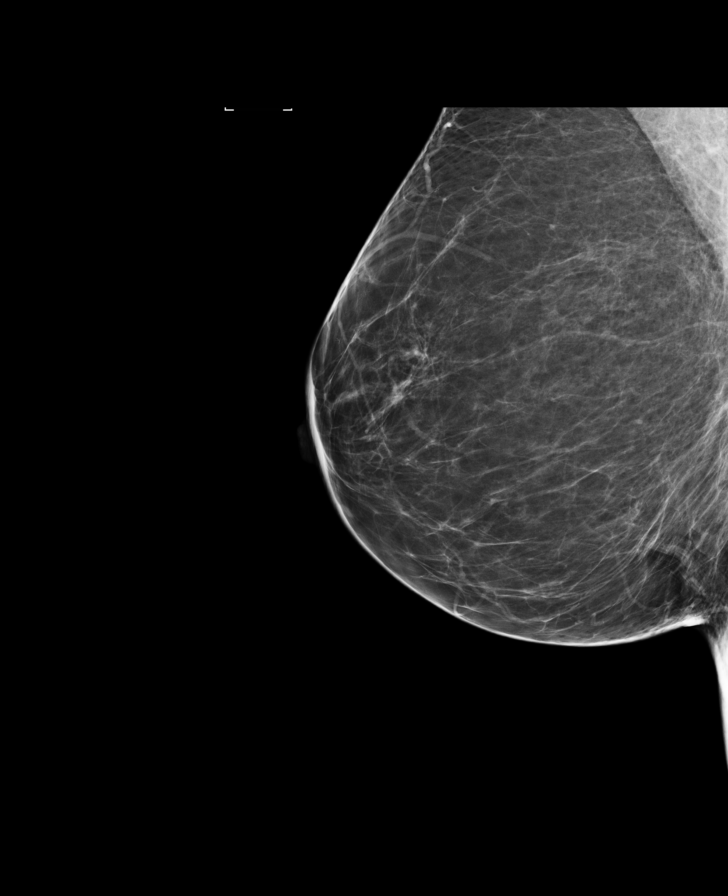

[L MLO synth-2D]
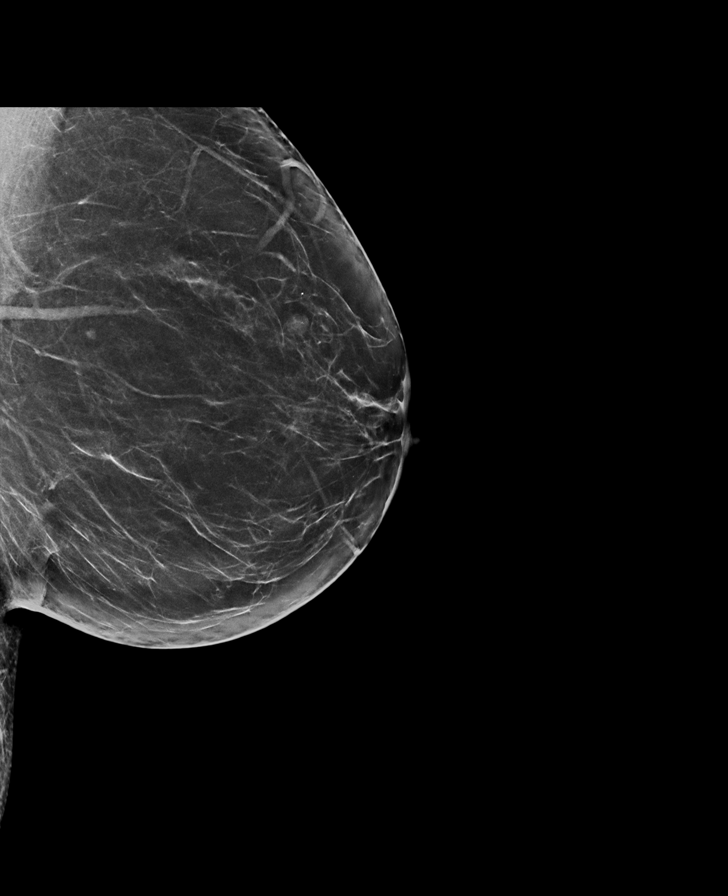

[R CC]
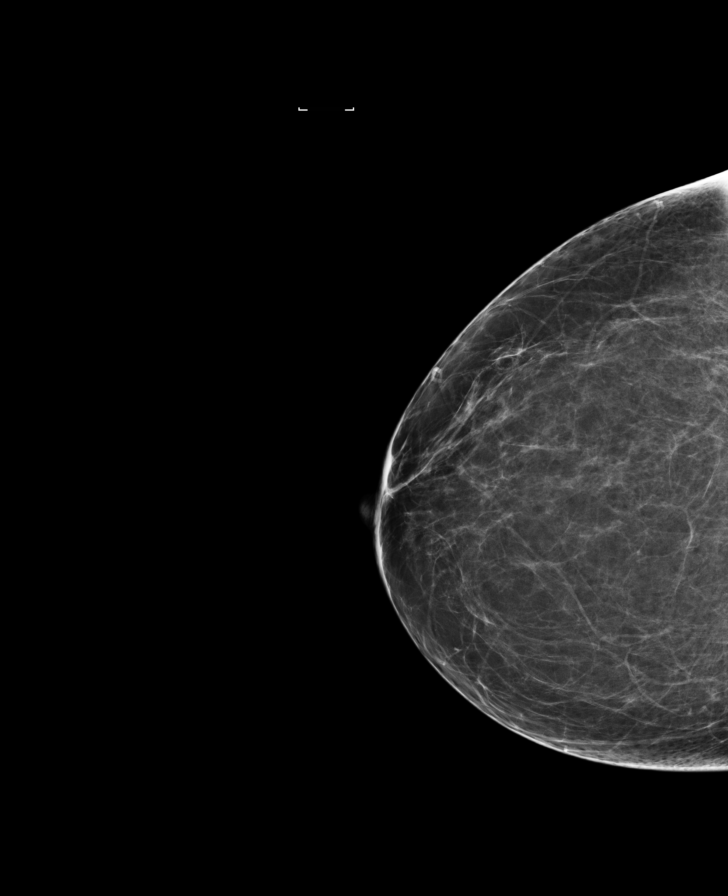

[R CC synth-2D]
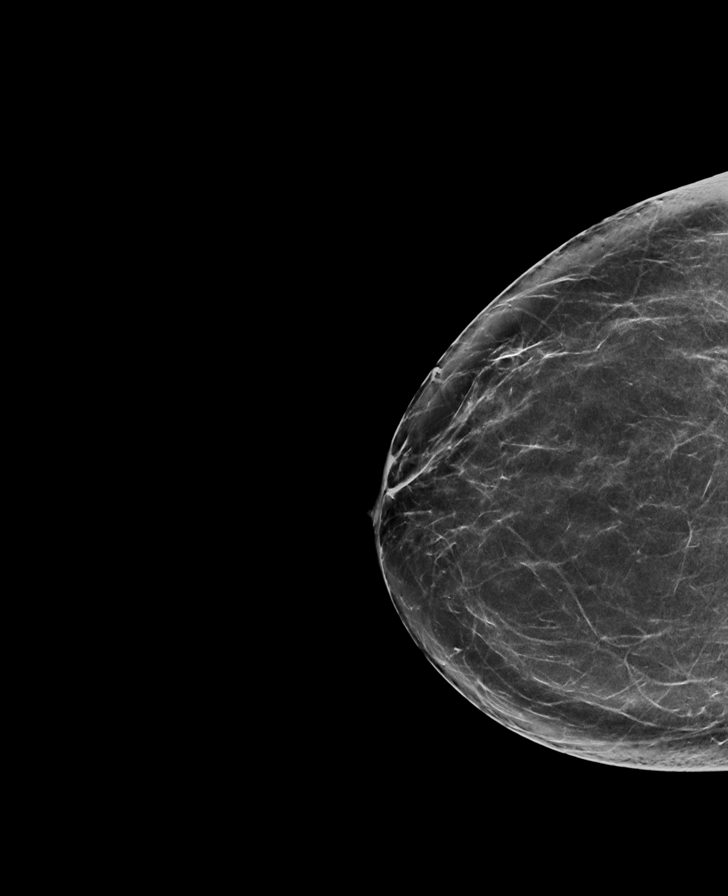

[L CC synth-2D]
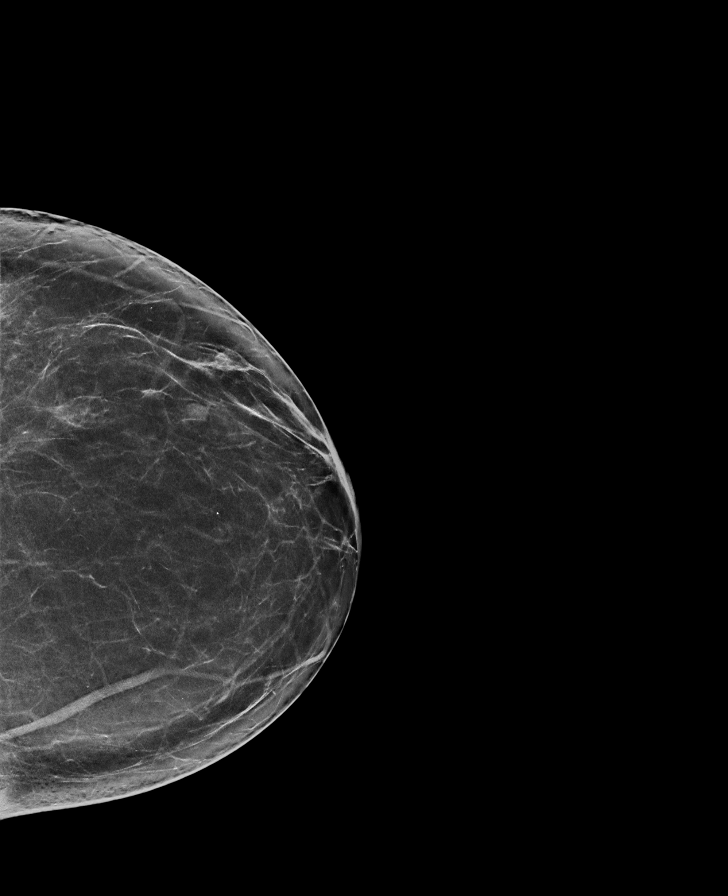

[L CC]
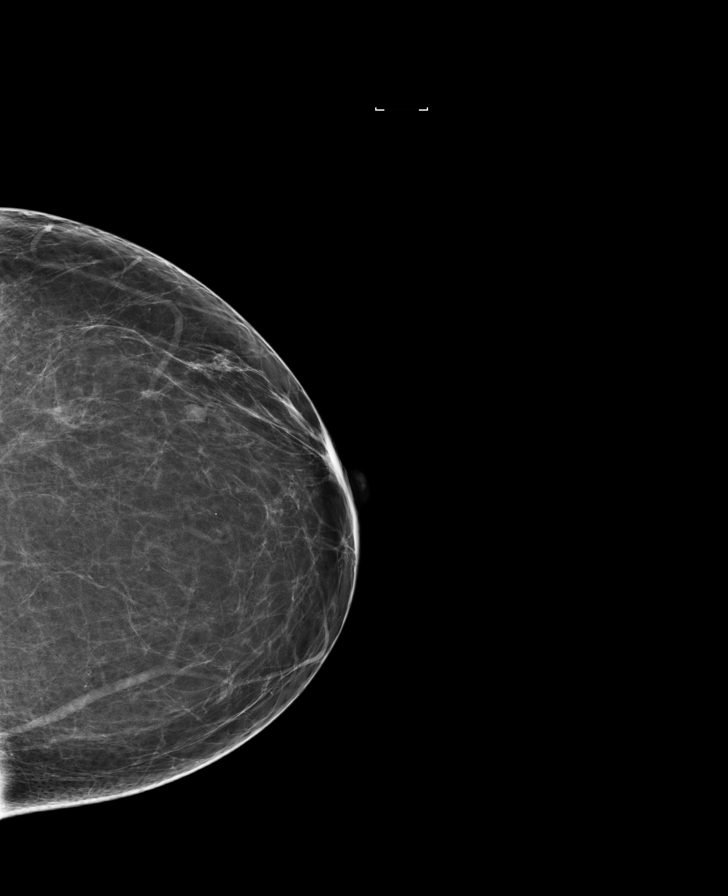

[8 of 28 positions shown; findings below may reference images not displayed]

ACR Breast Density Category b: There are scattered areas of
fibroglandular density.
FINDINGS: In the left breast, a possible mass warrants further evaluation. In
the right breast, no findings suspicious for malignancy.

Images were processed with CAD.
IMPRESSION: Further evaluation is suggested for possible mass in the left
breast.

RECOMMENDATION:
Diagnostic mammogram and possibly ultrasound of the left breast.
(Code:[id number])

The patient will be contacted regarding the findings, and additional
imaging will be scheduled.

BI-RADS CATEGORY  0: Incomplete. Need additional imaging evaluation
and/or prior mammograms for comparison.

## 2018-01-10 ENCOUNTER — Other Ambulatory Visit: Payer: Self-pay | Admitting: Obstetrics and Gynecology

## 2018-01-10 DIAGNOSIS — R928 Other abnormal and inconclusive findings on diagnostic imaging of breast: Secondary | ICD-10-CM

## 2018-01-11 ENCOUNTER — Ambulatory Visit: Payer: BLUE CROSS/BLUE SHIELD

## 2018-01-11 ENCOUNTER — Ambulatory Visit
Admission: RE | Admit: 2018-01-11 | Discharge: 2018-01-11 | Disposition: A | Discharge status: Home / Self Care | Payer: BLUE CROSS/BLUE SHIELD | Source: Ambulatory Visit | Attending: Obstetrics and Gynecology | Admitting: Obstetrics and Gynecology

## 2018-01-11 DIAGNOSIS — R928 Other abnormal and inconclusive findings on diagnostic imaging of breast: Secondary | ICD-10-CM

## 2018-08-18 HISTORY — PX: CARDIOVASCULAR STRESS TEST: SHX262

## 2018-08-27 NOTE — Progress Notes (Signed)
HPI: 53 year old female for evaluation of chest pain.  Stress echocardiogram October 2010 normal.  Echocardiogram November 2017 showed normal LV function and mild diastolic dysfunction.  Cardiopulmonary stress test November 2017 showed normal functional capacity with no ventilatory limitation.  Calcium score November 2017 0.  Patient typically has dyspnea with more vigorous activities but not routine activities.  No orthopnea, PND or pedal edema.  Over the past 1 month she has had pain in her left arm that has been continuous.  She has had difficulties with left arm pain in the past requiring injections.  She also states 1 week ago she had chest pain in the left upper chest.  It would last 1 second and resolve and she had 3 separate episodes of this.  She does not have exertional chest pain.  She is concerned because of her family history.  Cardiology asked to evaluate.  Current Outpatient Medications  Medication Sig Dispense Refill  . aspirin EC 81 MG tablet Take 81 mg by mouth daily.     No current facility-administered medications for this visit.      Past Medical History:  Diagnosis Date  . CHEST PAIN UNSPECIFIED 05/11/2009   Qualifier: Diagnosis of  By: Annamary Rummage, RN, BSN, Jacquelyn    . Depression   . Hyperglycemia   . Hyperlipidemia   . OTHER DYSPNEA AND RESPIRATORY ABNORMALITIES 05/11/2009   Qualifier: Diagnosis of  By: Annamary Rummage RN, BSN, Jacquelyn      Past Surgical History:  Procedure Laterality Date  . ABDOMINOPLASTY     "tummy tuck"  . BREAST REDUCTION SURGERY    . REDUCTION MAMMAPLASTY Bilateral 2017    Social History   Socioeconomic History  . Marital status: Married    Spouse name: Not on file  . Number of children: 5  . Years of education: Not on file  . Highest education level: Not on file  Occupational History  . Not on file  Social Needs  . Financial resource strain: Not on file  . Food insecurity:    Worry: Not on file    Inability: Not on file  .  Transportation needs:    Medical: Not on file    Non-medical: Not on file  Tobacco Use  . Smoking status: Never Smoker  . Smokeless tobacco: Never Used  Substance and Sexual Activity  . Alcohol use: Yes    Comment: occasionally  . Drug use: No  . Sexual activity: Yes  Lifestyle  . Physical activity:    Days per week: Not on file    Minutes per session: Not on file  . Stress: Not on file  Relationships  . Social connections:    Talks on phone: Not on file    Gets together: Not on file    Attends religious service: Not on file    Active member of club or organization: Not on file    Attends meetings of clubs or organizations: Not on file    Relationship status: Not on file  . Intimate partner violence:    Fear of current or ex partner: Not on file    Emotionally abused: Not on file    Physically abused: Not on file    Forced sexual activity: Not on file  Other Topics Concern  . Not on file  Social History Narrative  . Not on file    Family History  Problem Relation Age of Onset  . Diabetes Mother   . Breast cancer Mother   .  Heart attack Sister   . Transient ischemic attack Brother     ROS: no fevers or chills, productive cough, hemoptysis, dysphasia, odynophagia, melena, hematochezia, dysuria, hematuria, rash, seizure activity, orthopnea, PND, pedal edema, claudication. Remaining systems are negative.  Physical Exam: Well-developed well-nourished in no acute distress.  Skin is warm and dry.  HEENT is normal.  Neck is supple.  Chest is clear to auscultation with normal expansion.  Cardiovascular exam is regular rate and rhythm.  Abdominal exam nontender or distended. No masses palpated. Extremities show no edema. neuro grossly intact  ECG-normal sinus rhythm at a rate of 67, nonspecific ST changes.  Personally reviewed  A/P  1 chest pain/left upper extremity pain-symptoms extremely atypical.  She is very concerned given her family history.  I will arrange a  stress echocardiogram for risk stratification.   2 hyperlipidemia-recent LDL elevated.  I have discussed the importance of diet and exercise with her.  May need statin in the future.  Follow-up primary care.  3 anxiety/depression-follow-up primary care.  Olga Millers, MD

## 2018-08-29 ENCOUNTER — Ambulatory Visit (INDEPENDENT_AMBULATORY_CARE_PROVIDER_SITE_OTHER): Payer: No Typology Code available for payment source | Admitting: Cardiology

## 2018-08-29 ENCOUNTER — Encounter: Payer: Self-pay | Admitting: Cardiology

## 2018-08-29 VITALS — BP 110/82 | HR 67 | Ht 64.0 in | Wt 144.0 lb

## 2018-08-29 DIAGNOSIS — R072 Precordial pain: Secondary | ICD-10-CM

## 2018-08-29 DIAGNOSIS — E7800 Pure hypercholesterolemia, unspecified: Secondary | ICD-10-CM

## 2018-08-29 NOTE — Patient Instructions (Signed)
Medication Instructions:   NO CHANGE  Testing/Procedures:  Your physician has requested that you have a stress echocardiogram. For further information please visit [email or web address] Please follow instruction sheet as given.   Follow-Up:  Your physician recommends that you schedule a follow-up appointment in: AS NEEDED PENDING TEST RESULTS    Exercise Stress Echocardiogram  An exercise stress echocardiogram is a test that checks how well your heart is working. For this test, you will walk on a treadmill to make your heart beat faster. This test uses sound waves (ultrasound) and a computer to make pictures (images) of your heart. These pictures will be taken before you exercise and after you exercise. What happens before the procedure?  Follow instructions from your doctor about what you cannot eat or drink before the test.  Do not drink or eat anything that has caffeine in it. Stop having caffeine for 24 hours before the test.  Ask your doctor about changing or stopping your normal medicines. This is important if you take diabetes medicines or blood thinners. Ask your doctor if you should take your medicines with water before the test.  If you use an inhaler, bring it to the test.  Do not use any products that have nicotine or tobacco in them, such as cigarettes and e-cigarettes. Stop using them for 4 hours before the test. If you need help quitting, ask your doctor.  Wear comfortable shoes and clothing. What happens during the procedure?  You will be hooked up to a TV screen. Your doctor will watch the screen to see how fast your heart beats during the test.  Before you exercise, a computer will make a picture of your heart. To do this: ? A gel will be put on your chest. ? A wand will be moved over the gel. ? Sound waves from the wand will go to the computer to make the picture.  Your will start walking on a treadmill. The treadmill will start at a slow speed. It will get  faster a little bit at a time. When you walk faster, your heart will beat faster.  The treadmill will be stopped when your heart is working hard.  You will lie down right away so another picture of your heart can be taken.  The test will take 30-60 minutes. What happens after the procedure?  Your heart rate and blood pressure will be watched after the test.  If your doctor says that you can, you may: ? Eat what you usually eat. ? Do your normal activities. ? Take medicines like normal. Summary  An exercise stress echocardiogram is a test that checks how well your heart is working.  Follow instructions about what you cannot eat or drink before the test. Ask your doctor if you should take your normal medicines before the test.  Stop having caffeine for 24 hours before the test. Do not use anything with nicotine or tobacco in it for 4 hours before the test.  A computer will take a picture of your heart before you walk on a treadmill. It will take another picture when you are done walking.  Your heart rate and blood pressure will be watched after the test. This information is not intended to replace advice given to you by your health care provider. Make sure you discuss any questions you have with your health care provider. Document Released: 05/01/2009 Document Revised: 03/27/2016 Document Reviewed: 03/27/2016 Elsevier Interactive Patient Education  2019 [employer].

## 2018-09-03 ENCOUNTER — Ambulatory Visit (HOSPITAL_BASED_OUTPATIENT_CLINIC_OR_DEPARTMENT_OTHER)
Admission: RE | Admit: 2018-09-03 | Discharge: 2018-09-03 | Disposition: A | Discharge status: Home / Self Care | Payer: No Typology Code available for payment source | Source: Ambulatory Visit | Attending: Cardiology | Admitting: Cardiology

## 2018-09-03 DIAGNOSIS — R072 Precordial pain: Secondary | ICD-10-CM

## 2018-09-03 NOTE — Progress Notes (Signed)
  Echocardiogram Echocardiogram Stress Test has been performed.  Megan Rojas T Rabab Currington 09/03/2018, 4:01 PM

## 2019-05-09 ENCOUNTER — Other Ambulatory Visit: Payer: Self-pay | Admitting: Cardiology

## 2019-05-09 DIAGNOSIS — Z20822 Contact with and (suspected) exposure to covid-19: Secondary | ICD-10-CM

## 2019-05-11 LAB — NOVEL CORONAVIRUS, NAA: SARS-CoV-2, NAA: NOT DETECTED

## 2019-07-19 DIAGNOSIS — R7303 Prediabetes: Secondary | ICD-10-CM

## 2019-07-19 HISTORY — DX: Prediabetes: R73.03

## 2019-12-18 DIAGNOSIS — K5730 Diverticulosis of large intestine without perforation or abscess without bleeding: Secondary | ICD-10-CM | POA: Insufficient documentation

## 2019-12-18 DIAGNOSIS — Z8719 Personal history of other diseases of the digestive system: Secondary | ICD-10-CM | POA: Insufficient documentation

## 2019-12-18 DIAGNOSIS — D649 Anemia, unspecified: Secondary | ICD-10-CM | POA: Insufficient documentation

## 2020-04-17 ENCOUNTER — Other Ambulatory Visit: Payer: Self-pay

## 2020-04-20 ENCOUNTER — Ambulatory Visit (INDEPENDENT_AMBULATORY_CARE_PROVIDER_SITE_OTHER): Payer: No Typology Code available for payment source | Admitting: Family Medicine

## 2020-04-20 ENCOUNTER — Encounter: Payer: Self-pay | Admitting: Family Medicine

## 2020-04-20 ENCOUNTER — Other Ambulatory Visit: Payer: Self-pay

## 2020-04-20 VITALS — BP 133/84 | HR 59 | Temp 98.2°F | Resp 16 | Ht 63.75 in | Wt 148.6 lb

## 2020-04-20 DIAGNOSIS — F410 Panic disorder [episodic paroxysmal anxiety] without agoraphobia: Secondary | ICD-10-CM | POA: Diagnosis not present

## 2020-04-20 DIAGNOSIS — F4010 Social phobia, unspecified: Secondary | ICD-10-CM

## 2020-04-20 DIAGNOSIS — F331 Major depressive disorder, recurrent, moderate: Secondary | ICD-10-CM | POA: Diagnosis not present

## 2020-04-20 DIAGNOSIS — R739 Hyperglycemia, unspecified: Secondary | ICD-10-CM | POA: Diagnosis not present

## 2020-04-20 DIAGNOSIS — F411 Generalized anxiety disorder: Secondary | ICD-10-CM

## 2020-04-20 LAB — COMPREHENSIVE METABOLIC PANEL
ALT: 40 U/L — ABNORMAL HIGH (ref 0–35)
AST: 36 U/L (ref 0–37)
Albumin: 4.4 g/dL (ref 3.5–5.2)
Alkaline Phosphatase: 45 U/L (ref 39–117)
BUN: 11 mg/dL (ref 6–23)
CO2: 26 mEq/L (ref 19–32)
Calcium: 9.4 mg/dL (ref 8.4–10.5)
Chloride: 106 mEq/L (ref 96–112)
Creatinine, Ser: 0.73 mg/dL (ref 0.40–1.20)
GFR: 82.59 mL/min (ref >60.00)
Glucose, Bld: 97 mg/dL (ref 70–99)
Potassium: 4.5 mEq/L (ref 3.5–5.1)
Sodium: 140 mEq/L (ref 135–145)
Total Bilirubin: 0.5 mg/dL (ref 0.2–1.2)
Total Protein: 7.1 g/dL (ref 6.0–8.3)

## 2020-04-20 LAB — CBC WITH DIFFERENTIAL/PLATELET
Basophils Absolute: 0 10*3/uL (ref 0.0–0.1)
Basophils Relative: 0.8 % (ref 0.0–3.0)
Eosinophils Absolute: 0.3 10*3/uL (ref 0.0–0.7)
Eosinophils Relative: 5.1 % — ABNORMAL HIGH (ref 0.0–5.0)
HCT: 40.9 % (ref 36.0–46.0)
Hemoglobin: 14.4 g/dL (ref 12.0–15.0)
Lymphocytes Relative: 32.3 % (ref 12.0–46.0)
Lymphs Abs: 1.7 10*3/uL (ref 0.7–4.0)
MCHC: 35.1 g/dL (ref 30.0–36.0)
MCV: 91.7 fl (ref 78.0–100.0)
Monocytes Absolute: 0.4 10*3/uL (ref 0.1–1.0)
Monocytes Relative: 8.5 % (ref 3.0–12.0)
Neutro Abs: 2.8 10*3/uL (ref 1.4–7.7)
Neutrophils Relative %: 53.3 % (ref 43.0–77.0)
Platelets: 220 10*3/uL (ref 150.0–400.0)
RBC: 4.47 Mil/uL (ref 3.87–5.11)
RDW: 13.2 % (ref 11.5–15.5)
WBC: 5.2 10*3/uL (ref 4.0–10.5)

## 2020-04-20 LAB — TSH: TSH: 1.88 u[IU]/mL (ref 0.35–4.50)

## 2020-04-20 LAB — HEMOGLOBIN A1C: Hgb A1c MFr Bld: 5.9 % (ref 4.6–6.5)

## 2020-04-20 MED ORDER — BUPROPION HCL ER (XL) 150 MG PO TB24: 150.0000 mg | ORAL_TABLET | Freq: Every day | ORAL | 1 refills | Status: DC

## 2020-04-20 NOTE — Progress Notes (Signed)
Office Note 04/20/2020  CC:  Chief Complaint  Patient presents with  . Establish Care    Previous PCP at [facility] 6 years ago    HPI:  Megan Rojas is a 54 y.o. female who is here to establish care Patient's most recent primary MD: see above (Dr. Foy Guadalajara). Old records in EPIC/[facility] EMR were reviewed prior to or during today's visit.  GYN is Dr. Rana Snare.  She worries about diabtes b/c strong fam hx.  Sept 8 lipid panel all normal: Tchol 145, trig 151, HDL 47, LDL 72--reviewed from pt's phone today.  Pap and breast exam 03/2020.  I reviewed Dr. Vance Gather most recent note.  Long hx of depression, was on meds for long term, self-d/c'd about 1 yr ago (wellbutrin xl, occ another antidep added). She is going through depressed mood with high anxiety lately.  At least the last 9 mo sx's worsening. Lots of guilt.  Self isolating, feeling social anxiety.  Starting to feel hopeless.  She is irritable, has dec concentration/focus, "snaps at husband" a lot.  Her youngest child just moved to [place] for college. Eats a lot when she gets anx/dep.  She is VERY fearful of being overweight/obese. Has not had counseling in quite a few years, says she doesn't feel comfortable, feels to inhibited and shy.   Says husband is not sympathetic to her feelings/situation. Worries about her kids.    Past Medical History:  Diagnosis Date  . Anxiety and depression   . CHEST PAIN UNSPECIFIED 05/11/2009   Qualifier: Diagnosis of  By: Annamary Rummage, RN, BSN, Jacquelyn    . Diverticulitis    recurrent  . Hyperglycemia   . Hyperlipidemia    statin since approx 2018  . OTHER DYSPNEA AND RESPIRATORY ABNORMALITIES 05/11/2009   Qualifier: Diagnosis of  By: Annamary Rummage, RN, BSN, Jacquelyn    . Panic attacks     Past Surgical History:  Procedure Laterality Date  . ABDOMINOPLASTY     "tummy tuck"  . cardiopulmonary exercise test  2017   NORMAL  . CARDIOVASCULAR STRESS TEST  08/2018   NORMAL/NEG  . CHOLECYSTECTOMY     . COLONOSCOPY  2016   recall 10 yrs  . REDUCTION MAMMAPLASTY Bilateral 2017    Family History  Problem Relation Age of Onset  . Diabetes Mother   . Breast cancer Mother   . Heart attack Sister   . Diabetes Sister   . Transient ischemic attack Brother   . Diabetes Brother   . Diabetes Maternal Grandmother   . Diabetes Maternal Grandfather     Social History   Socioeconomic History  . Marital status: Married    Spouse name: Not on file  . Number of children: 5  . Years of education: Not on file  . Highest education level: Not on file  Occupational History  . Not on file  Tobacco Use  . Smoking status: Never Smoker  . Smokeless tobacco: Never Used  Vaping Use  . Vaping Use: Never used  Substance and Sexual Activity  . Alcohol use: Yes    Comment: occasionally  . Drug use: No  . Sexual activity: Yes  Other Topics Concern  . Not on file  Social History Narrative   Married, 5 children.   Orig from [place], has lived in [place] all her life, though.   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.   No Tob.   Alc: none   Social Determinants of Corporate investment banker Strain:   .  Difficulty of Paying Living Expenses: Not on file  Food Insecurity:   . Worried About Programme researcher, broadcasting/film/video in the Last Year: Not on file  . Ran Out of Food in the Last Year: Not on file  Transportation Needs:   . Lack of Transportation (Medical): Not on file  . Lack of Transportation (Non-Medical): Not on file  Physical Activity:   . Days of Exercise per Week: Not on file  . Minutes of Exercise per Session: Not on file  Stress:   . Feeling of Stress : Not on file  Social Connections:   . Frequency of Communication with Friends and Family: Not on file  . Frequency of Social Gatherings with Friends and Family: Not on file  . Attends Religious Services: Not on file  . Active Member of Clubs or Organizations: Not on file  . Attends Banker Meetings: Not on file  . Marital  Status: Not on file  Intimate Partner Violence:   . Fear of Current or Ex-Partner: Not on file  . Emotionally Abused: Not on file  . Physically Abused: Not on file  . Sexually Abused: Not on file    Outpatient Encounter Medications as of 04/20/2020  Medication Sig  . aspirin EC 81 MG tablet Take 81 mg by mouth daily.  . Bioflavonoid Products (VITAMIN C-BIOFLAVONOIDS) 1000-100 MG TBCR   . Cholecalciferol (VITAMIN D3) 10 MCG (400 UNIT) CAPS   . Multiple Minerals-Vitamins (CALCIUM CITRATE PLUS/MAGNESIUM PO)   . psyllium (REGULOID) 0.52 g capsule   . rosuvastatin (CRESTOR) 10 MG tablet Take 10 mg by mouth daily.  . Zinc 10 MG LOZG   . zolpidem (AMBIEN) 10 MG tablet   . albuterol (VENTOLIN HFA) 108 (90 Base) MCG/ACT inhaler Inhale 2 puffs into the lungs every 6 (six) hours as needed. (Patient not taking: Reported on 04/20/2020)  . buPROPion (WELLBUTRIN XL) 150 MG 24 hr tablet Take 1 tablet (150 mg total) by mouth daily.   No facility-administered encounter medications on file as of 04/20/2020.    No Known Allergies  ROS Review of Systems  Constitutional: Negative for fatigue and fever.  HENT: Negative for congestion and sore throat.   Eyes: Negative for visual disturbance.  Respiratory: Negative for cough.   Cardiovascular: Negative for chest pain.  Gastrointestinal: Negative for abdominal pain and nausea.  Genitourinary: Negative for dysuria.  Musculoskeletal: Negative for back pain and joint swelling.  Skin: Negative for rash.  Neurological: Negative for weakness and headaches.  Hematological: Negative for adenopathy.  Psychiatric/Behavioral: Positive for decreased concentration, dysphoric mood and sleep disturbance. The patient is nervous/anxious.        See hpi    PE; Blood pressure 133/84, pulse (!) 59, temperature 98.2 F (36.8 C), temperature source Oral, resp. rate 16, height 5' 3.75" (1.619 m), weight 148 lb 9.6 oz (67.4 kg), SpO2 98 %. Body mass index is 25.71  kg/m.  Gen: Alert, well appearing.  Patient is oriented to person, place, time, and situation. AFFECT: pleasant, lucid thought and speech. TMH:DQQI: no injection, icteris, swelling, or exudate.  EOMI, PERRLA. Mouth: lips without lesion/swelling.  Oral mucosa pink and moist. Oropharynx without erythema, exudate, or swelling.  Neck - No masses or thyromegaly or limitation in range of motion CV: RRR, no m/r/g.   LUNGS: CTA bilat, nonlabored resps, good aeration in all lung fields. EXT: no clubbing or cyanosis.  no edema.   Pertinent labs:  None (see hpi for last lipid panel).  ASSESSMENT AND  PLAN:   New pt:  1) Recurrent MDD, GAD, social anxiety d/o, panic attacks. She is hypervigilant and has somewhat of a problem of excessive pressure to look attractive---esp regarding her weight. She had felt better when on wellbutrin xl so I'll restart that today. She declined counseling.   TSH and CMET and CBC today.  2) Hx of hyperglycemia (nonfasting, I believe), strong FH of DM. Check glucose now and Hba1c now.  An After Visit Summary was printed and given to the patient.  Spent 35 min with pt today reviewing HPI, reviewing relevant past history, doing exam, reviewing and discussing lab and imaging data, and formulating plans.  Return in about 4 weeks (around 05/18/2020) for f/u dep/anx.  Signed:  Santiago Bumpers, MD           04/20/2020

## 2020-04-23 ENCOUNTER — Ambulatory Visit: Payer: No Typology Code available for payment source | Admitting: Family Medicine

## 2020-05-15 ENCOUNTER — Other Ambulatory Visit: Payer: Self-pay

## 2020-05-15 ENCOUNTER — Other Ambulatory Visit: Payer: Self-pay | Admitting: Family Medicine

## 2020-05-18 ENCOUNTER — Encounter: Payer: Self-pay | Admitting: Family Medicine

## 2020-05-18 ENCOUNTER — Ambulatory Visit (INDEPENDENT_AMBULATORY_CARE_PROVIDER_SITE_OTHER): Payer: No Typology Code available for payment source | Admitting: Family Medicine

## 2020-05-18 ENCOUNTER — Other Ambulatory Visit: Payer: Self-pay

## 2020-05-18 VITALS — BP 117/77 | HR 58 | Temp 97.4°F | Resp 16 | Ht 63.75 in | Wt 143.4 lb

## 2020-05-18 DIAGNOSIS — Z833 Family history of diabetes mellitus: Secondary | ICD-10-CM | POA: Diagnosis not present

## 2020-05-18 DIAGNOSIS — F411 Generalized anxiety disorder: Secondary | ICD-10-CM

## 2020-05-18 DIAGNOSIS — F5101 Primary insomnia: Secondary | ICD-10-CM

## 2020-05-18 DIAGNOSIS — R7303 Prediabetes: Secondary | ICD-10-CM | POA: Diagnosis not present

## 2020-05-18 MED ORDER — ZOLPIDEM TARTRATE 10 MG PO TABS
ORAL_TABLET | ORAL | 5 refills | Status: DC
Start: 2020-05-18 — End: 2020-12-09

## 2020-05-18 MED ORDER — BUPROPION HCL ER (XL) 300 MG PO TB24: 300.0000 mg | ORAL_TABLET | Freq: Every day | ORAL | 1 refills | Status: DC

## 2020-05-18 NOTE — Progress Notes (Signed)
OFFICE VISIT  05/18/2020  CC:  Chief Complaint  Patient presents with  . Follow-up    depression,anxiety   HPI:    Patient is a 54 y.o. female who presents for 1 mo f/u anx/dep. A/P as of last visit: "1) Recurrent MDD, GAD, social anxiety d/o, panic attacks. She is hypervigilant and has somewhat of a problem of excessive pressure to look attractive---esp regarding her weight. She had felt better when on wellbutrin xl so I'll restart that today. She declined counseling.   TSH and CMET and CBC today.  2) Hx of hyperglycemia (nonfasting, I believe), strong FH of DM. Check glucose now and Hba1c now."  INTERIM HX: Labs normal last visit. Feeling better.  Anxiety level still a little high.  Mentions random periods of facial flushing, feeling warmth in face and neck. Mood better. Helping her daughter paint her home and this helps distract her/helps her cope. Diet pretty good, works out 4 x/week. Several family members with DM.  Insomnia: long hx, mind won't shut down. Has been on ambien long term, 10mg  qhs, followed/rx by Dr. , pt requests that I take over mgmt of this prob/med.  Feels no adverse effects. PMP AWARE reviewed today: most recent rx for ambien 10mg  was filled 04/23/20, # 30, rx by Dr. (GYN). No red flags.    Past Medical History:  Diagnosis Date  . Anxiety and depression   . CHEST PAIN UNSPECIFIED 05/11/2009   w/u has been normal.  . Diverticulitis    recurrent  . Family history of diabetes mellitus   . Hyperlipidemia    statin since approx 2018  . Panic attacks   . Prediabetes 2021   HbA1c 5.9%    Past Surgical History:  Procedure Laterality Date  . ABDOMINOPLASTY     "tummy tuck"  . cardiopulmonary exercise test  2017   NORMAL  . CARDIOVASCULAR STRESS TEST  08/2018   NORMAL/NEG  . CHOLECYSTECTOMY    . COLONOSCOPY  2016   recall 10 yrs  . REDUCTION MAMMAPLASTY Bilateral 2017    Outpatient Medications Prior to Visit  Medication Sig  Dispense Refill  . aspirin EC 81 MG tablet Take 81 mg by mouth daily.    . Bioflavonoid Products (VITAMIN C-BIOFLAVONOIDS) 1000-100 MG TBCR     . Cholecalciferol (VITAMIN D3) 10 MCG (400 UNIT) CAPS     . Multiple Minerals-Vitamins (CALCIUM CITRATE PLUS/MAGNESIUM PO)     . psyllium (REGULOID) 0.52 g capsule     . rosuvastatin (CRESTOR) 10 MG tablet Take 10 mg by mouth daily.    . Zinc 10 MG LOZG     . zolpidem (AMBIEN) 10 MG tablet     . buPROPion (WELLBUTRIN XL) 150 MG 24 hr tablet Take 1 tablet (150 mg total) by mouth daily. 30 tablet 1   No facility-administered medications prior to visit.    No Known Allergies  ROS As per HPI  PE: Vitals with BMI 05/18/2020 04/20/2020 08/29/2018  Height 5' 3.75" 5' 3.75" 5\' 4"   Weight 143 lbs 6 oz 148 lbs 10 oz 144 lbs  BMI 24.82 25.72 24.71  Systolic 117 133 06/20/2020  Diastolic 77 84 82  Pulse 58 59 67     Wt Readings from Last 2 Encounters:  05/18/20 143 lb 6.4 oz (65 kg)  04/20/20 148 lb 9.6 oz (67.4 kg)    Gen: alert, oriented x 4, affect pleasant.  Lucid thinking and conversation noted. HEENT: PERRLA, EOMI.   Neck: no LAD,  mass, or thyromegaly. CV: RRR, no m/r/g LUNGS: CTA bilat, nonlabored. NEURO: no tremor or tics noted on observation.  Coordination intact. CN 2-12 grossly intact bilaterally, strength 5/5 in all extremeties.  No ataxia. Face: no rash or flushing noted.  LABS:  Lab Results  Component Value Date   TSH 1.88 04/20/2020   Lab Results  Component Value Date   WBC 5.2 04/20/2020   HGB 14.4 04/20/2020   HCT 40.9 04/20/2020   MCV 91.7 04/20/2020   PLT 220.0 04/20/2020   Lab Results  Component Value Date   CREATININE 0.73 04/20/2020   BUN 11 04/20/2020   NA 140 04/20/2020   K 4.5 04/20/2020   CL 106 04/20/2020   CO2 26 04/20/2020   Lab Results  Component Value Date   ALT 40 (H) 04/20/2020   AST 36 04/20/2020   ALKPHOS 45 04/20/2020   BILITOT 0.5 04/20/2020   Lab Results  Component Value Date   CHOL  174 05/11/2009   Lab Results  Component Value Date   HDL 53.60 05/11/2009   Lab Results  Component Value Date   LDLCALC 102 (H) 05/11/2009   Lab Results  Component Value Date   TRIG 91.0 05/11/2009   Lab Results  Component Value Date   CHOLHDL 3 05/11/2009   Lab Results  Component Value Date   HGBA1C 5.9 04/20/2020    IMPRESSION AND PLAN:  1) GAD, flushing, social anxiety d/o, hx of panic attacks, hx of recurrent MDD: All improved on wellbutrin xl 150mg  qd x 51mo. Increase to 300mg  qd. Hopefully flushing will gradually dissipate on this treatment.  2) Prediabetes: strong FH DM: many sibs with this. She has stable prediabetes the last few years Hba1c 1 mo ago 5.9%). She is doing well with diet/exercise. Has lost 5 lbs in the last month. Plan repeat A1c q47mo.  3) Insomnia: anxiety-related. Long hx of ambien use--effective. I'll assume responsibility for mgmt of this now, rf'd ambien, #30, 1 qhs, #30, RF x 5. Controlled substance contract reviewed with patient today.  Patient signed this and it will be placed in the chart.    An After Visit Summary was printed and given to the patient.  FOLLOW UP: Return in about 6 weeks (around 06/29/2020) for f/u anxiety.  Signed:  5mo, MD           05/18/2020

## 2020-06-12 ENCOUNTER — Other Ambulatory Visit: Payer: Self-pay | Admitting: Family Medicine

## 2020-06-29 ENCOUNTER — Telehealth (INDEPENDENT_AMBULATORY_CARE_PROVIDER_SITE_OTHER): Payer: No Typology Code available for payment source | Admitting: Family Medicine

## 2020-06-29 ENCOUNTER — Other Ambulatory Visit: Payer: Self-pay

## 2020-06-29 ENCOUNTER — Encounter: Payer: Self-pay | Admitting: Family Medicine

## 2020-06-29 VITALS — BP 127/85 | HR 75 | Temp 98.6°F | Wt 145.0 lb

## 2020-06-29 DIAGNOSIS — F339 Major depressive disorder, recurrent, unspecified: Secondary | ICD-10-CM

## 2020-06-29 DIAGNOSIS — F411 Generalized anxiety disorder: Secondary | ICD-10-CM | POA: Diagnosis not present

## 2020-06-29 MED ORDER — BUPROPION HCL ER (XL) 300 MG PO TB24
300.0000 mg | ORAL_TABLET | Freq: Every day | ORAL | 3 refills | Status: DC
Start: 2020-06-29 — End: 2020-07-20

## 2020-06-29 NOTE — Progress Notes (Signed)
Virtual Visit via Video Note  I connected with pt on 06/29/20 at 10:00 AM EST by a video enabled telemedicine application and verified that I am speaking with the correct person using two identifiers.  Location patient: home, [place] Location provider:work or home office Persons participating in the virtual visit: patient, provider  I discussed the limitations of evaluation and management by telemedicine and the availability of in person appointments. The patient expressed understanding and agreed to proceed.   HPI: 54 y/o WF being seen today for 6 week f/u MDD/anxiety. A/P as of last visit: "1) GAD, flushing, social anxiety d/o, hx of panic attacks, hx of recurrent MDD: All improved on wellbutrin xl 150mg  qd x 56mo. Increase to 300mg  qd. Hopefully flushing will gradually dissipate on this treatment.  2) Prediabetes: strong FH DM: many sibs with this. She has stable prediabetes the last few years Hba1c 1 mo ago 5.9%. She is doing well with diet/exercise. Has lost 5 lbs in the last month. Plan repeat A1c q96mo.  3) Insomnia: anxiety-related. Long hx of ambien use--effective. I'll assume responsibility for mgmt of this now, rf'd ambien, #30, 1 qhs, #30, RF x 5. Controlled substance contract reviewed with patient today.  Patient signed this and it will be placed in the chart.  "  INTERIM HX: Doing fine, continues to feel like the wellbutrin has been helping.  Today's appt changed to virtual due to her having some sniffles last 1-2d, just flew in from working in .  No cough, no SOB, no fevers.  Still struggling some with feeling overwhelmed with so many things to do in life; work, family, holidays, recent job in 5mo, [place].  Worries in general about her health and memory a lot.  Brief periods of facial flushing. Tired at the end of the day but nothing profound. No signif hopelessness, anhedonia, or profound sadness.  No SI or HI.  ROS: See pertinent positives and negatives per  HPI.  Past Medical History:  Diagnosis Date   Anxiety and depression    CHEST PAIN UNSPECIFIED 05/11/2009   w/u has been normal.   Diverticulitis    recurrent   Family history of diabetes mellitus    Hyperlipidemia    statin since approx 2018   Panic attacks    Prediabetes 2021   HbA1c 5.9%    Past Surgical History:  Procedure Laterality Date   ABDOMINOPLASTY     "tummy tuck"   cardiopulmonary exercise test  2017   NORMAL   CARDIOVASCULAR STRESS TEST  08/2018   NORMAL/NEG   CHOLECYSTECTOMY     COLONOSCOPY  2016   recall 10 yrs   REDUCTION MAMMAPLASTY Bilateral 2017     Current Outpatient Medications:    aspirin EC 81 MG tablet, Take 81 mg by mouth daily., Disp: , Rfl:    Bioflavonoid Products (VITAMIN C-BIOFLAVONOIDS) 1000-100 MG TBCR, , Disp: , Rfl:    buPROPion (WELLBUTRIN XL) 300 MG 24 hr tablet, Take 1 tablet (300 mg total) by mouth daily., Disp: 30 tablet, Rfl: 1   Cholecalciferol (VITAMIN D3) 10 MCG (400 UNIT) CAPS, , Disp: , Rfl:    Multiple Minerals-Vitamins (CALCIUM CITRATE PLUS/MAGNESIUM PO), , Disp: , Rfl:    psyllium (REGULOID) 0.52 g capsule, , Disp: , Rfl:    rosuvastatin (CRESTOR) 10 MG tablet, Take 10 mg by mouth daily., Disp: , Rfl:    Zinc 10 MG LOZG, , Disp: , Rfl:    zolpidem (AMBIEN) 10 MG tablet, 1 tab po qhs  for insomnia, Disp: 30 tablet, Rfl: 5  EXAM:  VITALS per patient if applicable:  Vitals with BMI 05/18/2020 04/20/2020 08/29/2018  Height 5' 3.75" 5' 3.75" 5\' 4"   Weight 143 lbs 6 oz 148 lbs 10 oz 144 lbs  BMI 24.82 25.72 24.71  Systolic 117 133  Diastolic 77 84 82  Pulse 58 59 67     GENERAL: alert, oriented, appears well and in no acute distress  HEENT: atraumatic, conjunttiva clear, no obvious abnormalities on inspection of external nose and ears  NECK: normal movements of the head and neck  LUNGS: on inspection no signs of respiratory distress, breathing rate appears normal, no obvious gross SOB,  gasping or wheezing  CV: no obvious cyanosis  MS: moves all visible extremities without noticeable abnormality  PSYCH/NEURO: pleasant and cooperative, no obvious depression or anxiety, speech and thought processing grossly intact  LABS:   Chemistry      Component Value Date/Time   NA 140 04/20/2020 1222   K 4.5 04/20/2020 1222   CL 106 04/20/2020 1222   CO2 26 04/20/2020 1222   BUN 11 04/20/2020 1222   CREATININE 0.73 04/20/2020 1222      Component Value Date/Time   CALCIUM 9.4 04/20/2020 1222   ALKPHOS 45 04/20/2020 1222   AST 36 04/20/2020 1222   ALT 40 (H) 04/20/2020 1222   BILITOT 0.5 04/20/2020 1222     Lab Results  Component Value Date   CHOL 174 05/11/2009   HDL 53.60 05/11/2009   LDLCALC 102 (H) 05/11/2009   TRIG 91.0 05/11/2009   CHOLHDL 3 05/11/2009   Lab Results  Component Value Date   HGBA1C 5.9 04/20/2020   ASSESSMENT AND PLAN:  Discussed the following assessment and plan:  GAD, social anxiety d/o, recurrent MDD: continues to improve on wellbutrin xl 300 mg. No significant adverse effect from med. No changes today.  She asked if I would assume resp for managing her HLD/statin (her GYN had been doing this for years) and I said yes I would.   I discussed the assessment and treatment plan with the patient. The patient was provided an opportunity to ask questions and all were answered. The patient agreed with the plan and demonstrated an understanding of the instructions.    F/u: 2 mo  F/u anx, hld, prediab, + CPE  Signed:  06/20/2020, MD           06/29/2020

## 2020-07-18 ENCOUNTER — Other Ambulatory Visit: Payer: Self-pay | Admitting: Family Medicine

## 2020-12-09 ENCOUNTER — Other Ambulatory Visit: Payer: Self-pay | Admitting: Family Medicine

## 2020-12-09 NOTE — Telephone Encounter (Signed)
1 mo supply zolpidem eRx'd. Pt needs f/u.

## 2020-12-09 NOTE — Telephone Encounter (Signed)
Requesting: zolpidem Contract: 05/18/20 UDS: n/a Last Visit:1213/21 Next Visit: advised to f/u 2 mo Last Refill: 05/18/20(30,5)  Please Advise. Medication pending

## 2020-12-10 NOTE — Telephone Encounter (Signed)
Spoke with pt, voiced understanding regarding scheduling appt. Appt scheduled for 6/9

## 2020-12-24 ENCOUNTER — Encounter: Payer: Self-pay | Admitting: Family Medicine

## 2020-12-24 ENCOUNTER — Other Ambulatory Visit: Payer: Self-pay

## 2020-12-24 ENCOUNTER — Ambulatory Visit (INDEPENDENT_AMBULATORY_CARE_PROVIDER_SITE_OTHER): Payer: PRIVATE HEALTH INSURANCE | Admitting: Family Medicine

## 2020-12-24 VITALS — BP 106/74 | HR 67 | Temp 97.6°F | Resp 16 | Ht 63.75 in | Wt 148.2 lb

## 2020-12-24 DIAGNOSIS — F988 Other specified behavioral and emotional disorders with onset usually occurring in childhood and adolescence: Secondary | ICD-10-CM

## 2020-12-24 DIAGNOSIS — F411 Generalized anxiety disorder: Secondary | ICD-10-CM | POA: Diagnosis not present

## 2020-12-24 DIAGNOSIS — Z79899 Other long term (current) drug therapy: Secondary | ICD-10-CM

## 2020-12-24 DIAGNOSIS — E7800 Pure hypercholesterolemia, unspecified: Secondary | ICD-10-CM

## 2020-12-24 DIAGNOSIS — F5101 Primary insomnia: Secondary | ICD-10-CM

## 2020-12-24 DIAGNOSIS — F330 Major depressive disorder, recurrent, mild: Secondary | ICD-10-CM

## 2020-12-24 LAB — COMPREHENSIVE METABOLIC PANEL
ALT: 24 U/L (ref 0–35)
AST: 22 U/L (ref 0–37)
Albumin: 4.4 g/dL (ref 3.5–5.2)
Alkaline Phosphatase: 50 U/L (ref 39–117)
BUN: 17 mg/dL (ref 6–23)
CO2: 29 mEq/L (ref 19–32)
Calcium: 9.5 mg/dL (ref 8.4–10.5)
Chloride: 106 mEq/L (ref 96–112)
Creatinine, Ser: 0.74 mg/dL (ref 0.40–1.20)
GFR: 90.54 mL/min (ref >60.00)
Glucose, Bld: 106 mg/dL — ABNORMAL HIGH (ref 70–99)
Potassium: 4.4 mEq/L (ref 3.5–5.1)
Sodium: 143 mEq/L (ref 135–145)
Total Bilirubin: 0.4 mg/dL (ref 0.2–1.2)
Total Protein: 7.1 g/dL (ref 6.0–8.3)

## 2020-12-24 LAB — LIPID PANEL
Cholesterol: 162 mg/dL (ref 0–200)
HDL: 48.7 mg/dL (ref >39.00)
LDL Cholesterol: 85 mg/dL (ref 0–99)
NonHDL: 113.23
Total CHOL/HDL Ratio: 3
Triglycerides: 143 mg/dL (ref 0.0–149.0)
VLDL: 28.6 mg/dL (ref 0.0–40.0)

## 2020-12-24 MED ORDER — ZOLPIDEM TARTRATE 10 MG PO TABS
ORAL_TABLET | ORAL | 0 refills | Status: DC
Start: 2020-12-24 — End: 2021-03-04

## 2020-12-24 MED ORDER — METHYLPHENIDATE HCL ER (OSM) 27 MG PO TBCR: 27.0000 mg | EXTENDED_RELEASE_TABLET | ORAL | 0 refills | Status: DC

## 2020-12-24 MED ORDER — ROSUVASTATIN CALCIUM 10 MG PO TABS: 10.0000 mg | ORAL_TABLET | Freq: Every day | ORAL | 3 refills | Status: AC

## 2020-12-24 NOTE — Progress Notes (Signed)
OFFICE VISIT  12/24/2020  CC:  Chief Complaint  Patient presents with   Follow-up    RCI; pt is fasting   HPI:    Patient is a 55 y.o. Caucasian female who presents for 6 mo f/u GAD with social anxiety, recurrent MDD, insomnia, and HLD. A/P as of last visit: "GAD, social anxiety d/o, recurrent MDD: continues to improve on wellbutrin xl 300 mg. No significant adverse effect from med. No changes today.   She asked if I would assume resp for managing her HLD/statin (her GYN had been doing this for years) and I said yes I would."  INTERIM HX:  She stopped her wellbutrin 300 mg about 2 wks ago, weaned herself off b/c was not feeling any better regarding her anxiety.  Has a lot of "issues" going on.  Stressed about everything, poor motivation.  She's not happy.  Poor focus and concentration.  Easily sad, poor energy, anhedonia.  Appetite is good. Says she feels like she has had signif tendency to have poor focus and concentration, has to push herself extremely hard to do complex tasks, procrastinates, etc. Has never been treated for ADD.  Sleep is "awful", takes only 1/2 of ambien qhs, still often takes 3 hrs to fall asleep. Says her brain doesn't shut down.  Sleep hygiene is better.  She is not interested in talking to a counselor. Has signif fear of gaining wt on any other antidepressant and says this would make her feel more depressed.  Takes crestor 10mg  qd w/out side effect.  PMP AWARE reviewed today: most recent rx for ambien 10mg  was filled 12/09/20, # 30, rx by me. No red flags.    Past Medical History:  Diagnosis Date   Anxiety and depression    CHEST PAIN UNSPECIFIED 05/11/2009   w/u has been normal.   Diverticulitis    recurrent   Family history of diabetes mellitus    Hyperlipidemia    statin since approx 2018   Panic attacks    Prediabetes 2021   HbA1c 5.9%    Past Surgical History:  Procedure Laterality Date   ABDOMINOPLASTY     "tummy tuck"    cardiopulmonary exercise test  2017   NORMAL   CARDIOVASCULAR STRESS TEST  08/2018   NORMAL/NEG   CHOLECYSTECTOMY     COLONOSCOPY  2016   recall 10 yrs   REDUCTION MAMMAPLASTY Bilateral 2017    Outpatient Medications Prior to Visit  Medication Sig Dispense Refill   Bioflavonoid Products (VITAMIN C-BIOFLAVONOIDS) 1000-100 MG TBCR      Cholecalciferol (VITAMIN D3) 10 MCG (400 UNIT) CAPS      Multiple Minerals-Vitamins (CALCIUM CITRATE PLUS/MAGNESIUM PO)      Zinc 10 MG LOZG      rosuvastatin (CRESTOR) 10 MG tablet Take 10 mg by mouth daily.     zolpidem (AMBIEN) 10 MG tablet TAKE 1 TABLET BY MOUTH AT BEDTIME FOR INSOMNIA 30 tablet 0   buPROPion (WELLBUTRIN XL) 300 MG 24 hr tablet TAKE 1 TABLET BY MOUTH EVERY DAY (Patient not taking: Reported on 12/24/2020) 30 tablet 0   aspirin EC 81 MG tablet Take 81 mg by mouth daily. (Patient not taking: Reported on 12/24/2020)     psyllium (REGULOID) 0.52 g capsule  (Patient not taking: Reported on 12/24/2020)     No facility-administered medications prior to visit.    No Known Allergies  ROS As per HPI  PE: Vitals with BMI 12/24/2020 06/29/2020 05/18/2020  Height 5' 3.75" - 5'  3.75"  Weight 148 lbs 3 oz 145 lbs 143 lbs 6 oz  BMI 25.65 - 24.82  Systolic 106 127 809  Diastolic 74 85 77  Pulse 67 75 58     Gen: Alert, well appearing.  Patient is oriented to person, place, time, and situation. AFFECT: pleasant, lucid thought and speech. No further exam today.  LABS:  Lab Results  Component Value Date   TSH 1.88 04/20/2020   Lab Results  Component Value Date   WBC 5.2 04/20/2020   HGB 14.4 04/20/2020   HCT 40.9 04/20/2020   MCV 91.7 04/20/2020   PLT 220.0 04/20/2020   Lab Results  Component Value Date   CREATININE 0.73 04/20/2020   BUN 11 04/20/2020   NA 140 04/20/2020   K 4.5 04/20/2020   CL 106 04/20/2020   CO2 26 04/20/2020   Lab Results  Component Value Date   ALT 40 (H) 04/20/2020   AST 36 04/20/2020   ALKPHOS 45  04/20/2020   BILITOT 0.5 04/20/2020   Lab Results  Component Value Date   CHOL 174 05/11/2009   Lab Results  Component Value Date   HDL 53.60 05/11/2009   Lab Results  Component Value Date   LDLCALC 102 (H) 05/11/2009   Lab Results  Component Value Date   TRIG 91.0 05/11/2009   Lab Results  Component Value Date   CHOLHDL 3 05/11/2009   Lab Results  Component Value Date   HGBA1C 5.9 04/20/2020    IMPRESSION AND PLAN:  1) MDD, recurrent.  GAD.   Not doing well.  No SI or HI. Suspect that her sx's may be a result of ADD and treating this may then help her mood and anxiety levels improve. We'll hold off from trial of new antidepressant. Start trial of methylphenidate CR 27 mg qd.  Therapeutic expectations and side effect profile of medication discussed today.  Patient's questions answered.  2) Insomnia: not doing well. Hopefully working on #1 above will result in eventually getting better sleep. She'll continue ambien 10mg  qhs prn.  New rx for #30, RF x5 sent in today.  3) HLD: tolerating crestor 10mg  qd. FLP and hepatic panel today.  An After Visit Summary was printed and given to the patient.  FOLLOW UP: Return in about 4 weeks (around 01/21/2021) for f/u ADD/mood. Cpe 6 mo  Signed:  , MD           12/24/2020

## 2021-01-11 ENCOUNTER — Telehealth: Payer: Self-pay | Admitting: Family Medicine

## 2021-01-11 NOTE — Telephone Encounter (Signed)
Pt declined first available appt for 01/13/21. Advised pt to go to urgent care or ED

## 2021-01-11 NOTE — Telephone Encounter (Signed)
Pt called and said that she has poison oak or poison ivy and that she has always been allergic to it and that she was given a shot for it last time and wanted to know if she could come in for it today. She said it is running all over her body

## 2021-01-25 LAB — HM PAP SMEAR: HM Pap smear: NORMAL

## 2021-01-25 LAB — HM MAMMOGRAPHY

## 2021-01-25 LAB — RESULTS CONSOLE HPV: CHL HPV: NEGATIVE

## 2021-03-03 ENCOUNTER — Other Ambulatory Visit: Payer: Self-pay | Admitting: Family Medicine

## 2021-03-03 NOTE — Telephone Encounter (Signed)
Requesting: zolpidem Contract: 05/18/20 UDS: N/A Last Visit: 12/24/20 Next Visit:Return in about 4 weeks (around  01/21/2021) for f/u ADD/mood Last Refill: 12/24/20(30,0)  Please review and advise. Med pending

## 2021-03-04 ENCOUNTER — Other Ambulatory Visit: Payer: Self-pay | Admitting: Family Medicine

## 2021-03-04 MED ORDER — ZOLPIDEM TARTRATE 10 MG PO TABS: ORAL_TABLET | ORAL | 2 refills | Status: DC

## 2021-03-04 NOTE — Telephone Encounter (Signed)
Medication printed, please redo rx. Med pending

## 2021-03-04 NOTE — Addendum Note (Signed)
Addended by: Emi Holes D on: 03/04/2021 09:25 AM   Modules accepted: Orders

## 2021-08-24 ENCOUNTER — Other Ambulatory Visit: Payer: Self-pay | Admitting: Family Medicine

## 2021-08-25 NOTE — Telephone Encounter (Signed)
#  10 tabs eRx'd. Pt overdue for follow up.

## 2021-08-25 NOTE — Telephone Encounter (Signed)
Requesting: Zolpidem Contract: 05/18/20 UDS: N/A Last Visit: 12/24/20 Next Visit: advised to f/u 4 weeks Last Refill: 03/04/21(30,2)  Please Advise. Med pending

## 2021-08-26 NOTE — Telephone Encounter (Signed)
LM for pt regarding medication ?

## 2021-09-01 NOTE — Telephone Encounter (Signed)
Pt returned call and advised appt needed. Offered to schedule appt. Pt agreed and scheduled/

## 2021-09-01 NOTE — Telephone Encounter (Signed)
LM for pt regarding medication and needs to schedule f/u appt

## 2021-09-07 ENCOUNTER — Other Ambulatory Visit: Payer: Self-pay

## 2021-09-07 ENCOUNTER — Ambulatory Visit: Payer: PRIVATE HEALTH INSURANCE | Admitting: Family Medicine

## 2021-09-07 ENCOUNTER — Encounter: Payer: Self-pay | Admitting: Family Medicine

## 2021-09-07 VITALS — BP 106/72 | HR 79 | Temp 97.4°F | Ht 63.75 in | Wt 138.8 lb

## 2021-09-07 DIAGNOSIS — M542 Cervicalgia: Secondary | ICD-10-CM

## 2021-09-07 DIAGNOSIS — F5104 Psychophysiologic insomnia: Secondary | ICD-10-CM | POA: Diagnosis not present

## 2021-09-07 DIAGNOSIS — F411 Generalized anxiety disorder: Secondary | ICD-10-CM | POA: Diagnosis not present

## 2021-09-07 DIAGNOSIS — E785 Hyperlipidemia, unspecified: Secondary | ICD-10-CM | POA: Diagnosis not present

## 2021-09-07 MED ORDER — ZOLPIDEM TARTRATE 10 MG PO TABS: ORAL_TABLET | ORAL | 3 refills | Status: AC

## 2021-09-07 NOTE — Progress Notes (Addendum)
OFFICE VISIT  09/07/2021  CC:  Chief Complaint  Patient presents with   Follow-up   Patient is a 56 y.o. female who presents for f/u GAD, MDD, HLD. I last saw her 8 months ago. A/P as of that visit: "1) MDD, recurrent.  GAD.  Social anxiety disorder.  Not doing well.  No SI or HI. Suspect that her sx's may be a result of ADD and treating this may then help her mood and anxiety levels improve. Start trial of methylphenidate CR 27 mg qd.   We'll hold off from trial of new antidepressant.  She has fears of medications mainly based on their potential to cause her weight gain.   2) Insomnia: not doing well. Hopefully working on #1 above will result in eventually getting better sleep. She'll continue ambien 44m qhs prn.  New rx for #30, RF x5 sent in today.   3) HLD: tolerating crestor 128mqd. FLP and hepatic panel today."  INTERIM HX: C-Met and lipids excellent last visit. MaStephanees happy reports she is doing well.  She did not take the methylphenidate.  She got off the bupropion because she did not want to take medication anymore.  She finds that she is adjusting well to the things in her life and does not need any medication for anxiety at this time.  Denies depressed mood.   She still sleeps pretty poorly, says her mind will not shut down.  Typically gets 3 or 4 hours of sleep per night, often takes only a half of her Ambien tab. PMP AWARE reviewed today: most recent rx for amLorrin Maisas filled 08/25/21, # 10, rx by me.  Rx's in prior months had been for #30 tabs, rx's by me. No red flags.  She is taking her Crestor daily. Diet and exercise habits are great.  ROS as above, plus--> no fevers, no CP, no SOB, no wheezing, no cough, no dizziness, no HAs, no rashes, no melena/hematochezia.  No polyuria or polydipsia.  No myalgias or arthralgias other than her neck pain mentioned above..  No focal weakness, paresthesias, or tremors.  No acute vision or hearing abnormalities.  No dysuria or  unusual/new urinary urgency or frequency.  No recent changes in lower legs. No n/v/d or abd pain.  No palpitations.    Past Medical History:  Diagnosis Date   Anxiety and depression    CHEST PAIN UNSPECIFIED 05/11/2009   w/u has been normal.   Diverticulitis    recurrent   Family history of diabetes mellitus    Hyperlipidemia    statin since approx 2018   Panic attacks    Prediabetes 2021   HbA1c 5.9%    Past Surgical History:  Procedure Laterality Date   ABDOMINOPLASTY     "tummy tuck"   cardiopulmonary exercise test  2017   NORMAL   CARDIOVASCULAR STRESS TEST  08/2018   NORMAL/NEG   CHOLECYSTECTOMY     COLONOSCOPY  2016   recall 10 yrs   REDUCTION MAMMAPLASTY Bilateral 2017    Outpatient Medications Prior to Visit  Medication Sig Dispense Refill   Bioflavonoid Products (VITAMIN C-BIOFLAVONOIDS) 1000-100 MG TBCR      Cholecalciferol (VITAMIN D3) 10 MCG (400 UNIT) CAPS      Multiple Minerals-Vitamins (CALCIUM CITRATE PLUS/MAGNESIUM PO)      rosuvastatin (CRESTOR) 10 MG tablet Take 1 tablet (10 mg total) by mouth daily. 90 tablet 3   Zinc 10 MG LOZG      zolpidem (AMBIEN) 10 MG  tablet TAKE 1 TABLET BY MOUTH AT BEDTIME AS NEEDED FOR INSOMNIA 10 tablet 0   buPROPion (WELLBUTRIN XL) 300 MG 24 hr tablet TAKE 1 TABLET BY MOUTH EVERY DAY (Patient not taking: Reported on 12/24/2020) 30 tablet 0   methylphenidate 27 MG PO CR tablet Take 1 tablet (27 mg total) by mouth every morning. (Patient not taking: Reported on 09/07/2021) 30 tablet 0   No facility-administered medications prior to visit.    No Known Allergies  ROS As per HPI  PE: Vitals with BMI 09/07/2021 12/24/2020 06/29/2020  Height 5' 3.75" 5' 3.75" -  Weight 138 lbs 13 oz 148 lbs 3 oz 145 lbs  BMI 65.46 50.35 -  Systolic 465 681 275  Diastolic 72 74 85  Pulse 79 67 75     Physical Exam  General: Alert and well-appearing. Neck: Flexion and extension are full.  She has mild discomfort in right posterolateral  soft tissues of the neck with extension.  This discomfort limits rotation of the C-spine as well as lateral bending.  No radiation of the pain down the arms.  No focal trigger point palpable.  LABS:  Last CBC Lab Results  Component Value Date   WBC 5.2 04/20/2020   HGB 14.4 04/20/2020   HCT 40.9 04/20/2020   MCV 91.7 04/20/2020   RDW 13.2 04/20/2020   PLT 220.0 17/00/1749   Last metabolic panel Lab Results  Component Value Date   GLUCOSE 106 (H) 12/24/2020   NA 143 12/24/2020   K 4.4 12/24/2020   CL 106 12/24/2020   CO2 29 12/24/2020   BUN 17 12/24/2020   CREATININE 0.74 12/24/2020   CALCIUM 9.5 12/24/2020   PROT 7.1 12/24/2020   ALBUMIN 4.4 12/24/2020   BILITOT 0.4 12/24/2020   ALKPHOS 50 12/24/2020   AST 22 12/24/2020   ALT 24 12/24/2020   Last lipids Lab Results  Component Value Date   CHOL 162 12/24/2020   HDL 48.70 12/24/2020   LDLCALC 85 12/24/2020   TRIG 143.0 12/24/2020   CHOLHDL 3 12/24/2020   Last hemoglobin A1c Lab Results  Component Value Date   HGBA1C 5.9 04/20/2020   Last thyroid functions Lab Results  Component Value Date   TSH 1.88 04/20/2020   IMPRESSION AND PLAN:  #1 chronic anxiety.  She is doing well without medication.  In the past she has never stayed on medication long due to fear of gaining weight on medication.  2.  Insomnia.  She does well on 1/2-1 Ambien 10 mg tab nightly. Renewed prescription, #30 tabs, refill x3.  3.  Hyperlipidemia, tolerating Crestor 10 mg a day. Last LDL was 85 back in June 2022.  Plan repeat lipid and hepatic panel in 6 months.  #4 muscular neck pain.  Reassured. Recommended PT.  Patient wants to think about it.  Recommended use of ibuprofen or Tylenol as needed.  Apply heat as needed.  An After Visit Summary was printed and given to the patient.  FOLLOW UP: Return in about 6 months (around 03/07/2022) for annual CPE (fasting).  Signed:  Crissie Sickles, MD           09/07/2021

## 2021-09-27 ENCOUNTER — Other Ambulatory Visit: Payer: Self-pay

## 2021-09-28 ENCOUNTER — Encounter: Payer: Self-pay | Admitting: Family Medicine

## 2021-09-28 ENCOUNTER — Ambulatory Visit: Payer: PRIVATE HEALTH INSURANCE | Admitting: Family Medicine

## 2021-09-28 VITALS — BP 117/80 | HR 65 | Temp 97.5°F | Ht 63.75 in | Wt 138.0 lb

## 2021-09-28 DIAGNOSIS — R7301 Impaired fasting glucose: Secondary | ICD-10-CM

## 2021-09-28 DIAGNOSIS — R7303 Prediabetes: Secondary | ICD-10-CM

## 2021-09-28 DIAGNOSIS — Z23 Encounter for immunization: Secondary | ICD-10-CM

## 2021-09-28 LAB — POCT GLYCOSYLATED HEMOGLOBIN (HGB A1C)
HbA1c POC (<> result, manual entry): 5.2 % (ref 4.0–5.6)
HbA1c, POC (controlled diabetic range): 5.2 % (ref 0.0–7.0)
HbA1c, POC (prediabetic range): 5.2 % — AB (ref 5.7–6.4)
Hemoglobin A1C: 5.2 % (ref 4.0–5.6)

## 2021-09-28 NOTE — Progress Notes (Signed)
OFFICE VISIT ? ?09/28/2021 ? ?CC:  ?Chief Complaint  ?Patient presents with  ? Prediabetes  ?  Pt would like to discuss meds for prediabetes. Pt would like to  get rx for [facility]  ? ? ? ?Patient is a 56 y.o. female who presents for discussion of concern of prediabetes. ? ?HPI: ?Sh is feeling very well. ?She is concerned because she has been told multiple times in the past that she has prediabetes. ?She has a strong family history of diabetes.  Several family members recently were put on [facility] with great results and she is interested in getting on this medication. ? ?She exercises vigorously almost every day.  Her diet is healthy. ? ?04/20/2020 hemoglobin A1c was 5.9%.  I no other A1c measurements in her records. ? ? ?Past Medical History:  ?Diagnosis Date  ? Anxiety and depression   ? CHEST PAIN UNSPECIFIED 05/11/2009  ? w/u has been normal.  ? Diverticulitis   ? recurrent  ? Family history of diabetes mellitus   ? Hyperlipidemia   ? statin since approx 2018  ? Panic attacks   ? Prediabetes 2021  ? HbA1c 5.9%  ? ? ?Past Surgical History:  ?Procedure Laterality Date  ? ABDOMINOPLASTY    ? "tummy tuck"  ? cardiopulmonary exercise test  2017  ? NORMAL  ? CARDIOVASCULAR STRESS TEST  08/2018  ? NORMAL/NEG  ? CHOLECYSTECTOMY    ? COLONOSCOPY  2016  ? recall 10 yrs  ? REDUCTION MAMMAPLASTY Bilateral 2017  ? ? ?Outpatient Medications Prior to Visit  ?Medication Sig Dispense Refill  ? Bioflavonoid Products (VITAMIN C-BIOFLAVONOIDS) 1000-100 MG TBCR     ? Cholecalciferol (VITAMIN D3) 10 MCG (400 UNIT) CAPS     ? Multiple Minerals-Vitamins (CALCIUM CITRATE PLUS/MAGNESIUM PO)     ? rosuvastatin (CRESTOR) 10 MG tablet Take 1 tablet (10 mg total) by mouth daily. 90 tablet 3  ? Zinc 10 MG LOZG     ? zolpidem (AMBIEN) 10 MG tablet TAKE 1 TABLET BY MOUTH AT BEDTIME AS NEEDED FOR INSOMNIA 30 tablet 3  ? ?No facility-administered medications prior to visit.  ? ? ?No Known Allergies ? ?ROS ?As per HPI ? ?PE: ?Vitals with BMI  09/28/2021 09/07/2021 12/24/2020  ?Height 5' 3.75" 5' 3.75" 5' 3.75"  ?Weight 138 lbs 138 lbs 13 oz 148 lbs 3 oz  ?BMI 23.88 24.02 25.65  ?Systolic 117 106 549  ?Diastolic 80 72 74  ?Pulse 65 79 67  ? ? ? ?Physical Exam ? ?Gen: Alert, well appearing.  Patient is oriented to person, place, time, and situation. ?AFFECT: pleasant, lucid thought and speech. ?No further exam today ? ?LABS:  ?Last CBC ?Lab Results  ?Component Value Date  ? WBC 5.2 04/20/2020  ? HGB 14.4 04/20/2020  ? HCT 40.9 04/20/2020  ? MCV 91.7 04/20/2020  ? RDW 13.2 04/20/2020  ? PLT 220.0 04/20/2020  ? ?Last metabolic panel ?Lab Results  ?Component Value Date  ? GLUCOSE 106 (H) 12/24/2020  ? NA 143 12/24/2020  ? K 4.4 12/24/2020  ? CL 106 12/24/2020  ? CO2 29 12/24/2020  ? BUN 17 12/24/2020  ? CREATININE 0.74 12/24/2020  ? CALCIUM 9.5 12/24/2020  ? PROT 7.1 12/24/2020  ? ALBUMIN 4.4 12/24/2020  ? BILITOT 0.4 12/24/2020  ? ALKPHOS 50 12/24/2020  ? AST 22 12/24/2020  ? ALT 24 12/24/2020  ? ?Last lipids ?Lab Results  ?Component Value Date  ? CHOL 162 12/24/2020  ? HDL 48.70 12/24/2020  ?  LDLCALC 85 12/24/2020  ? TRIG 143.0 12/24/2020  ? CHOLHDL 3 12/24/2020  ? ?Last hemoglobin A1c ?Lab Results  ?Component Value Date  ? HGBA1C 5.2 09/28/2021  ? HGBA1C 5.2 09/28/2021  ? HGBA1C 5.2 (A) 09/28/2021  ? HGBA1C 5.2 09/28/2021  ? ?Last thyroid functions ?Lab Results  ?Component Value Date  ? TSH 1.88 04/20/2020  ? ?IMPRESSION AND PLAN: ? ?History of prediabetes. ?A1c 5.9% back in October 2021. ?POC Hba1c today is 5.2%. ?Reassured patient that she does not have prediabetes at this time and therefore no medication is indicated. ?BMI is excellent at 24. ?Continue excellent diet and exercise. ?We will recheck hemoglobin A1c 34-month follow-up. ? ?Preventative health: Shingrix No. 1 given today. ? ?An After Visit Summary was printed and given to the patient. ? ?FOLLOW UP: Return in about 6 months (around 03/31/2022) for annual CPE (fasting). ? ?Signed:  Santiago Bumpers, MD            09/28/2021 ? ? ?

## 2022-02-23 ENCOUNTER — Telehealth: Payer: Self-pay

## 2022-02-23 NOTE — Telephone Encounter (Signed)
[facility] called to verify ambien rx for patient. Last refill provided was 09/07/21 (30,3). Nothing further needed

## 2022-03-23 ENCOUNTER — Ambulatory Visit (INDEPENDENT_AMBULATORY_CARE_PROVIDER_SITE_OTHER): Payer: PRIVATE HEALTH INSURANCE

## 2022-03-23 DIAGNOSIS — Z23 Encounter for immunization: Secondary | ICD-10-CM

## 2022-03-31 ENCOUNTER — Encounter: Payer: PRIVATE HEALTH INSURANCE | Admitting: Family Medicine

## 2022-05-02 ENCOUNTER — Other Ambulatory Visit: Payer: Self-pay | Admitting: Family Medicine

## 2022-05-02 NOTE — Telephone Encounter (Signed)
We cannot send controlled medications out of state. This refill cannot be completed

## 2022-05-03 NOTE — Telephone Encounter (Signed)
LM for pt regarding medication ?

## 2022-05-06 NOTE — Telephone Encounter (Signed)
LM for pt regarding medication ?

## 2022-05-06 NOTE — Telephone Encounter (Signed)
MyChart message read.

## 2022-05-31 ENCOUNTER — Encounter: Payer: PRIVATE HEALTH INSURANCE | Admitting: Family Medicine
